# Patient Record
Sex: Female | Born: 1970
Health system: Southern US, Community
[De-identification: ages and names within clinical notes are randomized; demographics above are authoritative.]

## PROBLEM LIST (undated history)

## (undated) ENCOUNTER — Emergency Department: Admission: EM | Payer: BC Managed Care – PPO

## (undated) DIAGNOSIS — Z9851 Tubal ligation status: Secondary | ICD-10-CM

## (undated) DIAGNOSIS — N200 Calculus of kidney: Secondary | ICD-10-CM

## (undated) DIAGNOSIS — I10 Essential (primary) hypertension: Secondary | ICD-10-CM

## (undated) DIAGNOSIS — Z9049 Acquired absence of other specified parts of digestive tract: Secondary | ICD-10-CM

## (undated) DIAGNOSIS — F419 Anxiety disorder, unspecified: Secondary | ICD-10-CM

## (undated) HISTORY — PX: LITHOTRIPSY: SUR834

## (undated) HISTORY — PX: CHOLECYSTECTOMY: SHX55

## (undated) HISTORY — DX: Calculus of kidney: N20.0

---

## 1990-01-25 DIAGNOSIS — Z9851 Tubal ligation status: Secondary | ICD-10-CM

## 1990-01-25 HISTORY — DX: Tubal ligation status: Z98.51

## 2000-02-13 ENCOUNTER — Emergency Department (HOSPITAL_COMMUNITY): Admission: EM | Admit: 2000-02-13 | Discharge: 2000-02-13 | Payer: Self-pay | Admitting: Emergency Medicine

## 2000-02-14 ENCOUNTER — Encounter: Payer: Self-pay | Admitting: Emergency Medicine

## 2000-02-16 ENCOUNTER — Observation Stay (HOSPITAL_COMMUNITY): Admission: RE | Admit: 2000-02-16 | Discharge: 2000-02-17 | Payer: Self-pay | Admitting: General Surgery

## 2010-07-20 ENCOUNTER — Ambulatory Visit: Payer: Self-pay

## 2010-07-21 ENCOUNTER — Emergency Department (HOSPITAL_COMMUNITY): Payer: BC Managed Care – PPO

## 2010-07-21 ENCOUNTER — Encounter (HOSPITAL_COMMUNITY): Payer: Self-pay

## 2010-07-21 ENCOUNTER — Emergency Department (HOSPITAL_COMMUNITY)
Admission: EM | Admit: 2010-07-21 | Discharge: 2010-07-21 | Disposition: A | Discharge status: Home / Self Care | Payer: BC Managed Care – PPO | Attending: Emergency Medicine | Admitting: Emergency Medicine

## 2010-07-21 DIAGNOSIS — R300 Dysuria: Secondary | ICD-10-CM | POA: Insufficient documentation

## 2010-07-21 DIAGNOSIS — R109 Unspecified abdominal pain: Secondary | ICD-10-CM | POA: Insufficient documentation

## 2010-07-21 DIAGNOSIS — N200 Calculus of kidney: Secondary | ICD-10-CM | POA: Insufficient documentation

## 2010-07-21 HISTORY — DX: Tubal ligation status: Z98.51

## 2010-07-21 HISTORY — DX: Acquired absence of other specified parts of digestive tract: Z90.49

## 2010-07-21 LAB — URINALYSIS, ROUTINE W REFLEX MICROSCOPIC
Bilirubin Urine: NEGATIVE
Glucose, UA: NEGATIVE mg/dL
Ketones, ur: NEGATIVE mg/dL
Nitrite: NEGATIVE
Protein, ur: NEGATIVE mg/dL
Specific Gravity, Urine: 1.028 (ref 1.005–1.030)
Urobilinogen, UA: 0.2 mg/dL (ref 0.0–1.0)
pH: 5 (ref 5.0–8.0)

## 2010-07-21 LAB — BASIC METABOLIC PANEL
BUN: 9 mg/dL (ref 6–23)
CO2: 23 mEq/L (ref 19–32)
Calcium: 9.3 mg/dL (ref 8.4–10.5)
Chloride: 104 mEq/L (ref 96–112)
Creatinine, Ser: 0.67 mg/dL (ref 0.50–1.10)
GFR calc Af Amer: >60 mL/min (ref >60)
GFR calc non Af Amer: >60 mL/min (ref >60)
Glucose, Bld: 127 mg/dL — ABNORMAL HIGH (ref 70–99)
Potassium: 3.7 mEq/L (ref 3.5–5.1)
Sodium: 138 mEq/L (ref 135–145)

## 2010-07-21 LAB — DIFFERENTIAL
Basophils Absolute: 0 10*3/uL (ref 0.0–0.1)
Basophils Relative: 0 % (ref 0–1)
Eosinophils Absolute: 0.1 10*3/uL (ref 0.0–0.7)
Eosinophils Relative: 1 % (ref 0–5)
Lymphocytes Relative: 16 % (ref 12–46)
Lymphs Abs: 1.8 10*3/uL (ref 0.7–4.0)
Monocytes Absolute: 0.6 10*3/uL (ref 0.1–1.0)
Monocytes Relative: 5 % (ref 3–12)
Neutro Abs: 8.5 10*3/uL — ABNORMAL HIGH (ref 1.7–7.7)
Neutrophils Relative %: 77 % (ref 43–77)

## 2010-07-21 LAB — CBC
HCT: 40 % (ref 36.0–46.0)
Hemoglobin: 14 g/dL (ref 12.0–15.0)
MCH: 30.5 pg (ref 26.0–34.0)
MCHC: 35 g/dL (ref 30.0–36.0)
MCV: 87.1 fL (ref 78.0–100.0)
Platelets: 328 10*3/uL (ref 150–400)
RBC: 4.59 MIL/uL (ref 3.87–5.11)
RDW: 12.1 % (ref 11.5–15.5)
WBC: 11.1 10*3/uL — ABNORMAL HIGH (ref 4.0–10.5)

## 2010-07-21 LAB — URINE MICROSCOPIC-ADD ON

## 2010-07-21 LAB — POCT PREGNANCY, URINE: Preg Test, Ur: NEGATIVE

## 2010-07-23 ENCOUNTER — Ambulatory Visit (HOSPITAL_COMMUNITY)
Admission: RE | Admit: 2010-07-23 | Discharge: 2010-07-23 | Disposition: A | Discharge status: Home / Self Care | Payer: BC Managed Care – PPO | Source: Ambulatory Visit | Attending: Urology | Admitting: Urology

## 2010-07-23 DIAGNOSIS — E669 Obesity, unspecified: Secondary | ICD-10-CM | POA: Insufficient documentation

## 2010-07-23 DIAGNOSIS — N201 Calculus of ureter: Secondary | ICD-10-CM | POA: Insufficient documentation

## 2011-06-21 ENCOUNTER — Ambulatory Visit: Payer: Self-pay

## 2011-06-26 ENCOUNTER — Ambulatory Visit: Payer: Self-pay

## 2014-05-24 ENCOUNTER — Encounter (HOSPITAL_COMMUNITY): Payer: Self-pay | Admitting: *Deleted

## 2014-05-24 ENCOUNTER — Emergency Department (HOSPITAL_COMMUNITY)
Admission: EM | Admit: 2014-05-24 | Discharge: 2014-05-24 | Disposition: A | Discharge status: Home / Self Care | Payer: BC Managed Care – PPO | Attending: Emergency Medicine | Admitting: Emergency Medicine

## 2014-05-24 ENCOUNTER — Emergency Department (HOSPITAL_COMMUNITY): Payer: BC Managed Care – PPO

## 2014-05-24 DIAGNOSIS — F419 Anxiety disorder, unspecified: Secondary | ICD-10-CM | POA: Diagnosis not present

## 2014-05-24 DIAGNOSIS — Z87442 Personal history of urinary calculi: Secondary | ICD-10-CM | POA: Diagnosis not present

## 2014-05-24 DIAGNOSIS — I10 Essential (primary) hypertension: Secondary | ICD-10-CM | POA: Diagnosis not present

## 2014-05-24 DIAGNOSIS — R079 Chest pain, unspecified: Secondary | ICD-10-CM

## 2014-05-24 DIAGNOSIS — Z9851 Tubal ligation status: Secondary | ICD-10-CM | POA: Insufficient documentation

## 2014-05-24 DIAGNOSIS — Z9089 Acquired absence of other organs: Secondary | ICD-10-CM | POA: Diagnosis not present

## 2014-05-24 DIAGNOSIS — R55 Syncope and collapse: Secondary | ICD-10-CM | POA: Diagnosis not present

## 2014-05-24 DIAGNOSIS — Z79899 Other long term (current) drug therapy: Secondary | ICD-10-CM | POA: Diagnosis not present

## 2014-05-24 DIAGNOSIS — Z8742 Personal history of other diseases of the female genital tract: Secondary | ICD-10-CM | POA: Insufficient documentation

## 2014-05-24 HISTORY — DX: Essential (primary) hypertension: I10

## 2014-05-24 HISTORY — DX: Anxiety disorder, unspecified: F41.9

## 2014-05-24 LAB — COMPREHENSIVE METABOLIC PANEL
ALT: 29 U/L (ref 0–35)
AST: 26 U/L (ref 0–37)
Albumin: 4.1 g/dL (ref 3.5–5.2)
Alkaline Phosphatase: 65 U/L (ref 39–117)
Anion gap: 10 (ref 5–15)
BUN: 9 mg/dL (ref 6–23)
CO2: 24 mmol/L (ref 19–32)
Calcium: 9.4 mg/dL (ref 8.4–10.5)
Chloride: 106 mmol/L (ref 96–112)
Creatinine, Ser: 0.68 mg/dL (ref 0.50–1.10)
GFR calc Af Amer: >90 mL/min (ref >90)
GFR calc non Af Amer: >90 mL/min (ref >90)
Glucose, Bld: 145 mg/dL — ABNORMAL HIGH (ref 70–99)
Potassium: 3.2 mmol/L — ABNORMAL LOW (ref 3.5–5.1)
Sodium: 140 mmol/L (ref 135–145)
Total Bilirubin: 0.5 mg/dL (ref 0.3–1.2)
Total Protein: 7.7 g/dL (ref 6.0–8.3)

## 2014-05-24 LAB — CBC
HCT: 38.7 % (ref 36.0–46.0)
Hemoglobin: 13.5 g/dL (ref 12.0–15.0)
MCH: 30.3 pg (ref 26.0–34.0)
MCHC: 34.9 g/dL (ref 30.0–36.0)
MCV: 87 fL (ref 78.0–100.0)
Platelets: 283 10*3/uL (ref 150–400)
RBC: 4.45 MIL/uL (ref 3.87–5.11)
RDW: 12.2 % (ref 11.5–15.5)
WBC: 8.7 10*3/uL (ref 4.0–10.5)

## 2014-05-24 LAB — TROPONIN I: Troponin I: <0.03 ng/mL (ref <0.031)

## 2014-05-24 NOTE — ED Provider Notes (Signed)
CSN: [id number]     Arrival date & time 05/24/14  1425 History   First MD Initiated Contact with Patient 05/24/14 1652     Chief Complaint  Patient presents with  . Chest Pain  . Anxiety     (Consider location/radiation/quality/duration/timing/severity/associated sxs/prior Treatment) HPI Comments: Patient is a 44 year old female with history of anxiety. She presents for evaluation of chest tightness that she has been experiencing intermittently for the past 3 weeks. This occurs with exertion and with rest and lasts for several minutes. This discomfort radiates to her shoulders but she denies shortness of breath. Today she began to feel somewhat dizzy and as if she might pass out. She also reports tingling in her fingers that occurs along with the symptoms. She has been evaluated for this in the past and told that she may have anxiety.  She also reports a recent job change that seems to have coincided with the onset of her symptoms. She states that her work is now more physically and mentally strenuous.  Patient is a 44 y.o. female presenting with chest pain and anxiety. The history is provided by the patient.  Chest Pain Pain location:  Substernal area Pain quality: tightness   Pain radiates to:  R shoulder and L shoulder Pain radiates to the back: no   Pain severity:  Mild Onset quality:  Gradual Duration:  3 weeks Timing:  Intermittent Progression:  Worsening Chronicity:  New Relieved by:  Nothing Worsened by:  Nothing tried Ineffective treatments:  None tried Associated symptoms: anxiety   Anxiety Associated symptoms include chest pain.    Past Medical History  Diagnosis Date  . S/P tubal ligation 1992  . S/P cholecystectomy t  . Anxiety   . Renal disorder     kidney stones  . Hypertension    Past Surgical History  Procedure Laterality Date  . Cholecystectomy    . Lithotripsy     No family history on file. History  Substance Use Topics  . Smoking status: Never  Smoker   . Smokeless tobacco: Not on file  . Alcohol Use: No   OB History    No data available     Review of Systems  Cardiovascular: Positive for chest pain.  All other systems reviewed and are negative.     Allergies  Levaquin; Other; Paroxetine hcl; and Zithromax  Home Medications   Prior to Admission medications   Medication Sig Start Date End Date Taking? Authorizing Provider  ALPRAZolam Prudy Feeler) 0.5 MG tablet Take 0.5 mg by mouth at bedtime as needed for anxiety.   Yes Historical Provider, MD  hydrochlorothiazide (HYDRODIURIL) 12.5 MG tablet Take 12.5 mg by mouth daily.   Yes Historical Provider, MD   BP 153/95 mmHg  Pulse 88  Temp(Src) 98.1 F (36.7 C) (Oral)  Resp 16  Ht  (1.575 m)  Wt 210 lb (95.255 kg)  BMI 38.40 kg/m2  SpO2 94%  LMP 03/25/2014 Physical Exam  Constitutional: She is oriented to person, place, and time. She appears well-developed and well-nourished. No distress.  HENT:  Head: Normocephalic and atraumatic.  Neck: Normal range of motion. Neck supple.  Cardiovascular: Normal rate and regular rhythm.  Exam reveals no gallop and no friction rub.   No murmur heard. Pulmonary/Chest: Effort normal and breath sounds normal. No respiratory distress. She has no wheezes.  Abdominal: Soft. Bowel sounds are normal. She exhibits no distension. There is no tenderness.  Musculoskeletal: Normal range of motion.  Neurological: She is alert  and oriented to person, place, and time.  Skin: Skin is warm and dry. She is not diaphoretic.  Nursing note and vitals reviewed.   ED Course  Procedures (including critical care time) Labs Review Labs Reviewed  COMPREHENSIVE METABOLIC PANEL - Abnormal; Notable for the following:    Potassium 3.2 (*)    Glucose, Bld 145 (*)    All other components within normal limits  CBC  TROPONIN I    Imaging Review Dg Chest 2 View  05/24/2014   CLINICAL DATA:  Shortness of breath, chest pain off and on for 1 month.  Anxiety.  EXAM: CHEST  2 VIEW  COMPARISON:  None.  FINDINGS: Mild peribronchial thickening. Heart and mediastinal contours are within normal limits. No focal opacities or effusions. No acute bony abnormality.  IMPRESSION: Mild bronchitic changes.   Electronically Signed   By: Charlett NoseKevin  Dover M.D.   On: 05/24/2014 16:14     EKG Interpretation   Date/Time:  Friday May 24 2014 14:35:16 EDT Ventricular Rate:  95 PR Interval:  138 QRS Duration: 82 QT Interval:  354 QTC Calculation: 444 R Axis:   -10 Text Interpretation:  Normal sinus rhythm Normal ECG Confirmed by DELOS   MD, Gabriel Conry ([id number]) on 05/24/2014 5:26:34 PM      MDM   Final diagnoses:  None    Patient presents with chest tightness, dizziness, and near syncope that has been occurring intermittently for the past several weeks. Her EKG is unremarkable and laboratory studies are normal. She seems quite stressed out about a recent job change and I suspect this may be anxiety related. She reports a history of heart disease in her family and I feel as though follow-up with cardiology to discuss a stress test would be in her best interest. She will be given the contact information for the cardiology clinic whom she is to call to schedule this appointment. She understands to return in the meantime if her symptoms significantly worsen or change.    Geoffery Lyonsouglas Azriel Dancy, MD 05/24/14 [phone]

## 2014-05-24 NOTE — Discharge Instructions (Signed)
Follow-up with cardiology. The contact information has been provided in this discharge summary. Call to arrange this appointment.  Return to the emergency department if your symptoms significantly worsen or change.   Chest Pain (Nonspecific) It is often hard to give a specific diagnosis for the cause of chest pain. There is always a chance that your pain could be related to something serious, such as a heart attack or a blood clot in the lungs. You need to follow up with your health care provider for further evaluation. CAUSES   Heartburn.  Pneumonia or bronchitis.  Anxiety or stress.  Inflammation around your heart (pericarditis) or lung (pleuritis or pleurisy).  A blood clot in the lung.  A collapsed lung (pneumothorax). It can develop suddenly on its own (spontaneous pneumothorax) or from trauma to the chest.  Shingles infection (herpes zoster virus). The chest wall is composed of bones, muscles, and cartilage. Any of these can be the source of the pain.  The bones can be bruised by injury.  The muscles or cartilage can be strained by coughing or overwork.  The cartilage can be affected by inflammation and become sore (costochondritis). DIAGNOSIS  Lab tests or other studies may be needed to find the cause of your pain. Your health care provider may have you take a test called an ambulatory electrocardiogram (ECG). An ECG records your heartbeat patterns over a 24-hour period. You may also have other tests, such as:  Transthoracic echocardiogram (TTE). During echocardiography, sound waves are used to evaluate how blood flows through your heart.  Transesophageal echocardiogram (TEE).  Cardiac monitoring. This allows your health care provider to monitor your heart rate and rhythm in real time.  Holter monitor. This is a portable device that records your heartbeat and can help diagnose heart arrhythmias. It allows your health care provider to track your heart activity for several  days, if needed.  Stress tests by exercise or by giving medicine that makes the heart beat faster. TREATMENT   Treatment depends on what may be causing your chest pain. Treatment may include:  Acid blockers for heartburn.  Anti-inflammatory medicine.  Pain medicine for inflammatory conditions.  Antibiotics if an infection is present.  You may be advised to change lifestyle habits. This includes stopping smoking and avoiding alcohol, caffeine, and chocolate.  You may be advised to keep your head raised (elevated) when sleeping. This reduces the chance of acid going backward from your stomach into your esophagus. Most of the time, nonspecific chest pain will improve within 2-3 days with rest and mild pain medicine.  HOME CARE INSTRUCTIONS   If antibiotics were prescribed, take them as directed. Finish them even if you start to feel better.  For the next few days, avoid physical activities that bring on chest pain. Continue physical activities as directed.  Do not use any tobacco products, including cigarettes, chewing tobacco, or electronic cigarettes.  Avoid drinking alcohol.  Only take medicine as directed by your health care provider.  Follow your health care provider's suggestions for further testing if your chest pain does not go away.  Keep any follow-up appointments you made. If you do not go to an appointment, you could develop lasting (chronic) problems with pain. If there is any problem keeping an appointment, call to reschedule. SEEK MEDICAL CARE IF:   Your chest pain does not go away, even after treatment.  You have a rash with blisters on your chest.  You have a fever. SEEK IMMEDIATE MEDICAL CARE IF:  You have increased chest pain or pain that spreads to your arm, neck, jaw, back, or abdomen.  You have shortness of breath.  You have an increasing cough, or you cough up blood.  You have severe back or abdominal pain.  You feel nauseous or vomit.  You  have severe weakness.  You faint.  You have chills. This is an emergency. Do not wait to see if the pain will go away. Get medical help at once. Call your local emergency services (911 in [place] Do not drive yourself to the hospital. MAKE SURE YOU:   Understand these instructions.  Will watch your condition.  Will get help right away if you are not doing well or get worse. Document Released: 10/21/2004 Document Revised: 01/16/2013 Document Reviewed: 08/17/2007 [facility] Patient Information 2015 [place], [place]. This information is not intended to replace advice given to you by your health care provider. Make sure you discuss any questions you have with your health care provider.

## 2014-05-24 NOTE — ED Notes (Signed)
Pt diaphoretic, tired and c/o R shoulder pain that radiates to her back and into her R arm.   Also states sob.  VS stable.  States multiple visits to ER with same s/s that end up being dx as anxiety.

## 2014-10-08 ENCOUNTER — Institutional Professional Consult (permissible substitution): Payer: BC Managed Care – PPO | Admitting: Internal Medicine

## 2015-02-17 ENCOUNTER — Telehealth: Payer: Self-pay | Admitting: Primary Care

## 2015-02-17 NOTE — Telephone Encounter (Signed)
Okay to do as long as no other complaints.

## 2015-02-17 NOTE — Telephone Encounter (Signed)
Called and notified patient of [name]'s comments. Patient verbalized understanding.  

## 2015-02-17 NOTE — Telephone Encounter (Signed)
Patient's husband called to ask if patient can have a physical done the same time as her new patient appointment.  Patient has a hard time getting off work and would like to have it done the same day.  Patient isn't having any problems. Please advise.

## 2015-03-24 ENCOUNTER — Encounter: Payer: Self-pay | Admitting: Primary Care

## 2015-03-24 ENCOUNTER — Encounter (INDEPENDENT_AMBULATORY_CARE_PROVIDER_SITE_OTHER): Payer: Self-pay

## 2015-03-24 ENCOUNTER — Ambulatory Visit (INDEPENDENT_AMBULATORY_CARE_PROVIDER_SITE_OTHER): Payer: BC Managed Care – PPO | Admitting: Primary Care

## 2015-03-24 ENCOUNTER — Ambulatory Visit: Payer: BC Managed Care – PPO | Admitting: Primary Care

## 2015-03-24 VITALS — BP 126/84 | HR 81 | Temp 99.2°F | Ht 62.0 in | Wt 206.8 lb

## 2015-03-24 DIAGNOSIS — I10 Essential (primary) hypertension: Secondary | ICD-10-CM | POA: Diagnosis not present

## 2015-03-24 DIAGNOSIS — R300 Dysuria: Secondary | ICD-10-CM

## 2015-03-24 DIAGNOSIS — F411 Generalized anxiety disorder: Secondary | ICD-10-CM | POA: Insufficient documentation

## 2015-03-24 LAB — POC URINALSYSI DIPSTICK (AUTOMATED)
Bilirubin, UA: NEGATIVE
Glucose, UA: NEGATIVE
Ketones, UA: NEGATIVE
Leukocytes, UA: NEGATIVE
Nitrite, UA: NEGATIVE
Protein, UA: NEGATIVE
Spec Grav, UA: >=1.03
Urobilinogen, UA: NEGATIVE
pH, UA: 5.5

## 2015-03-24 MED ORDER — VENLAFAXINE HCL ER 37.5 MG PO CP24
37.5000 mg | ORAL_CAPSULE | Freq: Every day | ORAL | Status: DC
Start: 2015-03-24 — End: 2015-07-11

## 2015-03-24 MED ORDER — PHENAZOPYRIDINE HCL 200 MG PO TABS: 200.0000 mg | ORAL_TABLET | Freq: Three times a day (TID) | ORAL | Status: DC | PRN

## 2015-03-24 NOTE — Progress Notes (Signed)
Subjective:    Patient ID: Kayla Robles, female    DOB: April 01, 1970, 45 y.o.   MRN: [id number]  HPI  Kayla Robles is a 45 year old female who presents today to establish care and discuss the problems mentioned below. Will obtain old records. She's not completed a physical in over 1 year.   1) Generalized Anxiety Disorder: Diagnosed in 2003. Currently managed on alprazolam 0.5 mg once nightly. GAD 7 score of 12. She was once managed on Paxil but didn't feel well managed and also had suicidal thoughts on this medication.   2) Urinary Frequency: History of frequent UTI's in the past. She also has a history of kidney stones that were removed surgically years ago. She also reports dysuria, hematuria. She's been taking Cipro 500 mg BID from and old RX. She's noticed a moderate reduction overall in symptoms. This RX is one year out of date. Denies fevers, vaginal itching.   3) Essential Hypertension: Currently managed on HCTZ 12.5 mg daily. BP stable in the clinic today. Denies chest pain, dizziness, headaches, lower extremity edema.  Review of Systems  Constitutional: Positive for fever.  HENT: Negative for rhinorrhea.   Respiratory: Negative for shortness of breath.   Cardiovascular: Negative for chest pain.  Gastrointestinal: Negative for nausea.  Genitourinary: Positive for dysuria, frequency and vaginal discharge. Negative for difficulty urinating.  Musculoskeletal: Negative for myalgias.  Skin: Negative for rash.  Neurological: Negative for dizziness and headaches.  Psychiatric/Behavioral: Negative for suicidal ideas. The patient is nervous/anxious.        Past Medical History  Diagnosis Date  . S/P tubal ligation 1992  . S/P cholecystectomy t  . Anxiety   . Kidney stones   . Hypertension     Social History   Social History  . Marital Status: Married    Spouse Name: N/A  . Number of Children: N/A  . Years of Education: N/A   Occupational History  . Not on file.    Social History Main Topics  . Smoking status: Never Smoker   . Smokeless tobacco: Not on file  . Alcohol Use: No  . Drug Use: No  . Sexual Activity: Not on file   Other Topics Concern  . Not on file   Social History Narrative   Married.   Works as a Air traffic controller.   Enjoys playing with her grandchildren.       Past Surgical History  Procedure Laterality Date  . Cholecystectomy    . Lithotripsy      Family History  Problem Relation Age of Onset  . Arthritis Mother   . Heart disease Mother   . Hypertension Mother   . Hypertension Father   . Diabetes Father     Allergies  Allergen Reactions  . Levaquin [Levofloxacin]     "felt weird"  . Other     Most anxiety medications  . Paroxetine Hcl   . Zithromax [Azithromycin]     Current Outpatient Prescriptions on File Prior to Visit  Medication Sig Dispense Refill  . ALPRAZolam (XANAX) 0.5 MG tablet Take 0.5 mg by mouth at bedtime as needed for anxiety.    . hydrochlorothiazide (HYDRODIURIL) 12.5 MG tablet Take 12.5 mg by mouth daily.     No current facility-administered medications on file prior to visit.    BP 126/84 mmHg  Pulse 81  Temp(Src) 99.2 F (37.3 C) (Oral)  Ht  (1.575 m)  Wt 206 lb 12.8 oz (93.804 kg)  BMI  37.81 kg/m2  SpO2 96%  LMP 03/10/2015    Objective:   Physical Exam  Constitutional: She is oriented to person, place, and time. She appears well-nourished.  Neck: Neck supple.  Cardiovascular: Normal rate and regular rhythm.   Pulmonary/Chest: Effort normal and breath sounds normal.  Abdominal: There is no CVA tenderness.  Neurological: She is alert and oriented to person, place, and time.  Skin: Skin is warm and dry.  Psychiatric:  Anxious during examination.          Assessment & Plan:  >45 minutes spent face to face with patient, >50% spent counseling or coordinating care.  Dysuria:  Also with urinary frequency, pelvic discomfort. UA: negative for leuks and  nitirites. 2+ blood. Exam unremarkable for renal stones. Discussed that her Cipro treatment helped.  Will add pyridium for symptoms as UA does not suggest any bacterial involvement. Increase water consumption, return precautions provided.

## 2015-03-24 NOTE — Assessment & Plan Note (Signed)
Currently managed on HCTZ 12.5 mg. BP stable in clinic today. Continue current regimen.

## 2015-03-24 NOTE — Progress Notes (Signed)
Pre visit review using our clinic review tool, if applicable. No additional management support is needed unless otherwise documented below in the visit note. 

## 2015-03-24 NOTE — Assessment & Plan Note (Signed)
Uncontrolled. GAD 7 score of 12 today. Discussed that alprazolam is not used to treat long term GAD and that we will need to start daily medication in hopes of weaning off of alprazolam. Start low dose effexor. We discussed possible side effects of headache, GI upset, drowsiness, and SI/HI. If thoughts of SI/HI develop, we discussed to present to the emergency immediately. Patient verbalized understanding.   Refill provided of alprazolam while she is transitioning over to effexor. UDS and contract obtained today.  Follow up in 4-6 weeks.

## 2015-03-24 NOTE — Patient Instructions (Signed)
Start Venlafaxine XR 37.5 mg tablets for anxiety. Take 1 tablet by mouth every morning.  Use the alprazolam as needed for breakthrough panic attacks/anxiety. May continue to use at bedtime as needed.  You may take the pyridium three times daily as needed for urinary symptoms of burning and pelvic discomfort.  Schedule a follow up in appointment in 4 to 6 weeks for re-evaluation of anxiety.  It was a pleasure to meet you today! Please don't hesitate to call me with any questions. Welcome to [facility]!

## 2015-03-25 LAB — URINE CULTURE
Colony Count: NO GROWTH
Organism ID, Bacteria: NO GROWTH

## 2015-03-26 ENCOUNTER — Telehealth: Payer: Self-pay | Admitting: Primary Care

## 2015-03-26 NOTE — Telephone Encounter (Signed)
Message left for patient to return my call.  

## 2015-03-26 NOTE — Telephone Encounter (Signed)
Please notify Ms. Kayla Robles that her records have been reviewed and are ready for pick up at her convenience.

## 2015-03-27 NOTE — Telephone Encounter (Signed)
Pt called back. Notified of Kates comments. She will have her spouse pick up records.

## 2015-04-01 ENCOUNTER — Encounter: Payer: Self-pay | Admitting: Primary Care

## 2015-05-14 ENCOUNTER — Telehealth: Payer: Self-pay | Admitting: Primary Care

## 2015-05-14 NOTE — Telephone Encounter (Signed)
Patient returned Chan's call.  Patient is on break and has to go back to work at 3:30.  Patient asked for Johny DrillingChan to leave a detailed message on her voice mail, if she doesn't answer.

## 2015-05-19 ENCOUNTER — Encounter: Payer: Self-pay | Admitting: Primary Care

## 2015-05-19 ENCOUNTER — Other Ambulatory Visit: Payer: BC Managed Care – PPO

## 2015-05-19 ENCOUNTER — Ambulatory Visit (INDEPENDENT_AMBULATORY_CARE_PROVIDER_SITE_OTHER): Payer: BC Managed Care – PPO | Admitting: Primary Care

## 2015-05-19 VITALS — BP 142/92 | HR 86 | Temp 98.8°F | Ht 62.0 in | Wt 205.4 lb

## 2015-05-19 DIAGNOSIS — R110 Nausea: Secondary | ICD-10-CM

## 2015-05-19 DIAGNOSIS — F411 Generalized anxiety disorder: Secondary | ICD-10-CM | POA: Diagnosis not present

## 2015-05-19 MED ORDER — ONDANSETRON 4 MG PO TBDP: 4.0000 mg | ORAL_TABLET | Freq: Three times a day (TID) | ORAL | Status: DC | PRN

## 2015-05-19 NOTE — Assessment & Plan Note (Signed)
Improved on Effexor. She's also working to wean off Alprazolam by taking 1/2 tablet every other night on average. Discussed use of Melatonin PRN for insomnia. Will continue to monitor.

## 2015-05-19 NOTE — Patient Instructions (Signed)
Continue Venlafaxine 37.5 mg tablets for anxiety.   Continue to wean off Alprazolam as discussed by taking 1/2 tablet by mouth as needed for anxiety.  You may try switching to Melatonin or Unisom as needed for insomnia.  You may take the Zofran (ondansetron) tablets every 8 hours as needed for nausea.   Ensure you are staying hydrated with water and advance your diet slowly.  Please schedule a physical with me at your convenience.. You may also schedule a lab only appointment 3-4 days prior. We will discuss your lab results in detail during your physical.  It was a pleasure to see you today!  Food Choices to Help Relieve Diarrhea, Adult When you have diarrhea, the foods you eat and your eating habits are very important. Choosing the right foods and drinks can help relieve diarrhea. Also, because diarrhea can last up to 7 days, you need to replace lost fluids and electrolytes (such as sodium, potassium, and chloride) in order to help prevent dehydration.  WHAT GENERAL GUIDELINES DO I NEED TO FOLLOW?  Slowly drink 1 cup (8 oz) of fluid for each episode of diarrhea. If you are getting enough fluid, your urine will be clear or pale yellow.  Eat starchy foods. Some good choices include white rice, white toast, pasta, low-fiber cereal, baked potatoes (without the skin), saltine crackers, and bagels.  Avoid large servings of any cooked vegetables.  Limit fruit to two servings per day. A serving is  cup or 1 small piece.  Choose foods with less than 2 g of fiber per serving.  Limit fats to less than 8 tsp (38 g) per day.  Avoid fried foods.  Eat foods that have probiotics in them. Probiotics can be found in certain dairy products.  Avoid foods and beverages that may increase the speed at which food moves through the stomach and intestines (gastrointestinal tract). Things to avoid include:  High-fiber foods, such as dried fruit, raw fruits and vegetables, nuts, seeds, and whole grain  foods.  Spicy foods and high-fat foods.  Foods and beverages sweetened with high-fructose corn syrup, honey, or sugar alcohols such as xylitol, sorbitol, and mannitol. WHAT FOODS ARE RECOMMENDED? Grains White rice. White, [place], or pita breads (fresh or toasted), including plain rolls, buns, or bagels. White pasta. Saltine, soda, or graham crackers. Pretzels. Low-fiber cereal. Cooked cereals made with water (such as cornmeal, farina, or cream cereals). Plain muffins. Matzo. Melba toast. Zwieback.  Vegetables Potatoes (without the skin). Strained tomato and vegetable juices. Most well-cooked and canned vegetables without seeds. Tender lettuce. Fruits Cooked or canned applesauce, apricots, cherries, fruit cocktail, grapefruit, peaches, pears, or plums. Fresh bananas, apples without skin, cherries, grapes, cantaloupe, grapefruit, peaches, oranges, or plums.  Meat and Other Protein Products Baked or boiled chicken. Eggs. Tofu. Fish. Seafood. Smooth peanut butter. Ground or well-cooked tender beef, ham, veal, lamb, pork, or poultry.  Dairy Plain yogurt, kefir, and unsweetened liquid yogurt. Lactose-free milk, buttermilk, or soy milk. Plain hard cheese. Beverages Sport drinks. Clear broths. Diluted fruit juices (except prune). Regular, caffeine-free sodas such as ginger ale. Water. Decaffeinated teas. Oral rehydration solutions. Sugar-free beverages not sweetened with sugar alcohols. Other Bouillon, broth, or soups made from recommended foods.  The items listed above may not be a complete list of recommended foods or beverages. Contact your dietitian for more options. WHAT FOODS ARE NOT RECOMMENDED? Grains Whole grain, whole wheat, bran, or rye breads, rolls, pastas, crackers, and cereals. Wild or brown rice. Cereals that contain more than  2 g of fiber per serving. Corn tortillas or taco shells. Cooked or dry oatmeal. Granola. Popcorn. Vegetables Raw vegetables. Cabbage, broccoli, Brussels  sprouts, artichokes, baked beans, beet greens, corn, kale, legumes, peas, sweet potatoes, and yams. Potato skins. Cooked spinach and cabbage. Fruits Dried fruit, including raisins and dates. Raw fruits. Stewed or dried prunes. Fresh apples with skin, apricots, mangoes, pears, raspberries, and strawberries.  Meat and Other Protein Products Chunky peanut butter. Nuts and seeds. Beans and lentils. Tomasa Blase.  Dairy High-fat cheeses. Milk, chocolate milk, and beverages made with milk, such as milk shakes. Cream. Ice cream. Sweets and Desserts Sweet rolls, doughnuts, and sweet breads. Pancakes and waffles. Fats and Oils Butter. Cream sauces. Margarine. Salad oils. Plain salad dressings. Olives. Avocados.  Beverages Caffeinated beverages (such as coffee, tea, soda, or energy drinks). Alcoholic beverages. Fruit juices with pulp. Prune juice. Soft drinks sweetened with high-fructose corn syrup or sugar alcohols. Other Coconut. Hot sauce. Chili powder. Mayonnaise. Gravy. Cream-based or milk-based soups.  The items listed above may not be a complete list of foods and beverages to avoid. Contact your dietitian for more information. WHAT SHOULD I DO IF I BECOME DEHYDRATED? Diarrhea can sometimes lead to dehydration. Signs of dehydration include dark urine and dry mouth and skin. If you think you are dehydrated, you should rehydrate with an oral rehydration solution. These solutions can be purchased at pharmacies, retail stores, or online.  Drink -1 cup (120-240 mL) of oral rehydration solution each time you have an episode of diarrhea. If drinking this amount makes your diarrhea worse, try drinking smaller amounts more often. For example, drink 1-3 tsp (5-15 mL) every 5-10 minutes.  A general rule for staying hydrated is to drink 1-2 L of fluid per day. Talk to your health care provider about the specific amount you should be drinking each day. Drink enough fluids to keep your urine clear or pale yellow.     This information is not intended to replace advice given to you by your health care provider. Make sure you discuss any questions you have with your health care provider.   Document Released: 04/03/2003 Document Revised: 02/01/2014 Document Reviewed: 12/04/2012 Elsevier Interactive Patient Education [employer]

## 2015-05-19 NOTE — Progress Notes (Signed)
Pre visit review using our clinic review tool, if applicable. No additional management support is needed unless otherwise documented below in the visit note. 

## 2015-05-19 NOTE — Progress Notes (Signed)
Subjective:    Patient ID: Kayla Robles, female    DOB: Mar 29, 1970, 45 y.o.   MRN: [id number]  HPI  [name] is a 45 year old female who presents today for follow up of Generalized Anxiety Disorder. She was evaluated in late February 2017 as a new patient with complaints of anxiety. She was managed solely on Alprazolam 0.5 mg every night for anxiety as she failed treatment on Paxil in the past. Last visit she was initiated on Effexor 37.5 mg with a goal to wean off Alprazolam eventually.   Since her last visit she's feeling much improved in her anxiety. She is feeling more upbeat, she is able to "let things go" that once bothered her, is less irritable. Her family and co-workers have noticed an improvement in her anxiety. She's also trying to wean off of her Alprazolam as she's taking 1/2 tablet every other night on average. Denies chest pain, SI/HI, headaches.   2) Nausea/Vomiting/Diarrnea: Present since this morning. She's been around her husband who's had the same symptoms this past weekend. She's not taken anything this morning for her symptoms. Denies abdominal pain. She has had diarrhea for the past 24 hours.  Review of Systems  Constitutional: Positive for fatigue. Negative for fever and chills.  Gastrointestinal: Positive for nausea, vomiting and diarrhea. Negative for abdominal pain.  Neurological: Negative for headaches.  Psychiatric/Behavioral: Positive for sleep disturbance. Negative for suicidal ideas. The patient is not nervous/anxious.        Past Medical History  Diagnosis Date  . S/P tubal ligation 1992  . S/P cholecystectomy t  . Anxiety   . Kidney stones   . Hypertension      Social History   Social History  . Marital Status: Married    Spouse Name: N/A  . Number of Children: N/A  . Years of Education: N/A   Occupational History  . Not on file.   Social History Main Topics  . Smoking status: Never Smoker   . Smokeless tobacco: Not on file  .  Alcohol Use: No  . Drug Use: No  . Sexual Activity: Not on file   Other Topics Concern  . Not on file   Social History Narrative   Married.   Works as a Air traffic controller.   Enjoys playing with her grandchildren.       Past Surgical History  Procedure Laterality Date  . Cholecystectomy    . Lithotripsy      Family History  Problem Relation Age of Onset  . Arthritis Mother   . Heart disease Mother   . Hypertension Mother   . Hypertension Father   . Diabetes Father     Allergies  Allergen Reactions  . Levaquin [Levofloxacin]     "felt weird"  . Other     Most anxiety medications  . Paroxetine Hcl   . Zithromax [Azithromycin]     Current Outpatient Prescriptions on File Prior to Visit  Medication Sig Dispense Refill  . ALPRAZolam (XANAX) 0.5 MG tablet Take 0.5 mg by mouth at bedtime as needed for anxiety.    . hydrochlorothiazide (HYDRODIURIL) 12.5 MG tablet Take 12.5 mg by mouth daily.    Marland Kitchen venlafaxine XR (EFFEXOR XR) 37.5 MG 24 hr capsule Take 1 capsule (37.5 mg total) by mouth daily with breakfast. 30 capsule 3   No current facility-administered medications on file prior to visit.    BP 142/92 mmHg  Pulse 86  Temp(Src) 98.8 F (37.1 C) (Oral)  Ht 5\' 2"  (1.575 m)  Wt 205 lb 6.4 oz (93.169 kg)  BMI 37.56 kg/m2  SpO2 96%  LMP 05/11/2015    Objective:   Physical Exam  Constitutional: She appears well-nourished. She appears ill.  Cardiovascular: Normal rate and regular rhythm.   Pulmonary/Chest: Effort normal and breath sounds normal.  Abdominal: Soft. Bowel sounds are normal. There is no tenderness.  Skin: Skin is warm and dry.  Psychiatric: She has a normal mood and affect.          Assessment & Plan:  N/V/D:  Diarrhea x 24 hours.  Sudden onset of nausea with vomiting during our visit today. Exam unremarkable, although does appear ill. Rx for Zofran PRN. Discussed importance for adequate fluid intake to prevent dehydration. Advance diet as  tolerated. Return precautions provided.

## 2015-06-25 ENCOUNTER — Telehealth: Payer: Self-pay | Admitting: Primary Care

## 2015-06-25 DIAGNOSIS — I10 Essential (primary) hypertension: Secondary | ICD-10-CM

## 2015-06-25 MED ORDER — HYDROCHLOROTHIAZIDE 12.5 MG PO TABS: 12.5000 mg | ORAL_TABLET | Freq: Every day | ORAL | Status: DC

## 2015-06-25 NOTE — Telephone Encounter (Signed)
Called and notified patient that refill has [facility] to [facility] as requested.

## 2015-06-25 NOTE — Telephone Encounter (Signed)
Pt left voice message on triage phone requesting refill on hydrochlorothiazide (HYDRODIURIL) 12.5 MG tablet.  Pt last seen on 05/19/15 for acute visit and on 03/24/15 for office visit.  [facility] of choice is [facility].  Patient requesting callback at work number [phone], ask for pt by name.

## 2015-06-25 NOTE — Telephone Encounter (Signed)
Message left for patient to return my call.  

## 2015-06-25 NOTE — Telephone Encounter (Signed)
Refill sent to pharmacy.   

## 2015-07-11 ENCOUNTER — Other Ambulatory Visit: Payer: Self-pay | Admitting: Primary Care

## 2015-07-31 ENCOUNTER — Telehealth: Payer: Self-pay

## 2015-07-31 NOTE — Telephone Encounter (Addendum)
Spoken to patient and she does not have a form to be filled out. After speaking to patient about why she needed the blood work done. Come to the conclusion that patient is asking for a physical with same day labs. Patient verbalized understanding. Patient will call back later and schedule a physical and have lab done the same day.

## 2015-07-31 NOTE — Telephone Encounter (Signed)
Noted. We will evaluate patient at her upcoming physical.

## 2015-07-31 NOTE — Telephone Encounter (Signed)
Pt left v/m; pt is not able to schedule CPX at this time but needs to [facility] and have labs drawn for ins purposes. Pt request Lipid, CMP and CBC and anything else Jae DireKate would need. Pt request cb with appt if OKed by Jae DireKate.last f/u 05/19/15.

## 2015-07-31 NOTE — Telephone Encounter (Signed)
Message left for patient to return my call.  

## 2015-07-31 NOTE — Telephone Encounter (Signed)
Does she have a form that is to be completed? If so most of the time those forms require a physical examination which would require an appointment.  If no form completion is required then I will order labs but cannot do this routinely.

## 2015-08-11 ENCOUNTER — Other Ambulatory Visit: Payer: Self-pay | Admitting: Primary Care

## 2015-08-11 MED ORDER — VENLAFAXINE HCL ER 37.5 MG PO CP24: ORAL_CAPSULE | ORAL | Status: DC

## 2015-09-08 ENCOUNTER — Ambulatory Visit (INDEPENDENT_AMBULATORY_CARE_PROVIDER_SITE_OTHER): Payer: BC Managed Care – PPO | Admitting: Primary Care

## 2015-09-08 ENCOUNTER — Other Ambulatory Visit (HOSPITAL_COMMUNITY)
Admission: RE | Admit: 2015-09-08 | Discharge: 2015-09-08 | Disposition: A | Discharge status: Home / Self Care | Payer: BC Managed Care – PPO | Source: Ambulatory Visit | Attending: Primary Care | Admitting: Primary Care

## 2015-09-08 VITALS — BP 134/84 | HR 78 | Temp 98.7°F | Ht 62.0 in | Wt 209.0 lb

## 2015-09-08 DIAGNOSIS — I10 Essential (primary) hypertension: Secondary | ICD-10-CM | POA: Diagnosis not present

## 2015-09-08 DIAGNOSIS — Z01419 Encounter for gynecological examination (general) (routine) without abnormal findings: Secondary | ICD-10-CM | POA: Insufficient documentation

## 2015-09-08 DIAGNOSIS — E119 Type 2 diabetes mellitus without complications: Secondary | ICD-10-CM | POA: Insufficient documentation

## 2015-09-08 DIAGNOSIS — Z0000 Encounter for general adult medical examination without abnormal findings: Secondary | ICD-10-CM

## 2015-09-08 DIAGNOSIS — Z1151 Encounter for screening for human papillomavirus (HPV): Secondary | ICD-10-CM | POA: Diagnosis not present

## 2015-09-08 DIAGNOSIS — F411 Generalized anxiety disorder: Secondary | ICD-10-CM | POA: Diagnosis not present

## 2015-09-08 DIAGNOSIS — Z124 Encounter for screening for malignant neoplasm of cervix: Secondary | ICD-10-CM

## 2015-09-08 DIAGNOSIS — R7303 Prediabetes: Secondary | ICD-10-CM | POA: Diagnosis not present

## 2015-09-08 DIAGNOSIS — E1165 Type 2 diabetes mellitus with hyperglycemia: Secondary | ICD-10-CM | POA: Insufficient documentation

## 2015-09-08 LAB — LIPID PANEL
Cholesterol: 201 mg/dL — ABNORMAL HIGH (ref 0–200)
HDL: 38.4 mg/dL — ABNORMAL LOW (ref >39.00)
NonHDL: 162.74
Total CHOL/HDL Ratio: 5
Triglycerides: 289 mg/dL — ABNORMAL HIGH (ref 0.0–149.0)
VLDL: 57.8 mg/dL — ABNORMAL HIGH (ref 0.0–40.0)

## 2015-09-08 LAB — COMPREHENSIVE METABOLIC PANEL
ALT: 28 U/L (ref 0–35)
AST: 25 U/L (ref 0–37)
Albumin: 4.5 g/dL (ref 3.5–5.2)
Alkaline Phosphatase: 67 U/L (ref 39–117)
BUN: 11 mg/dL (ref 6–23)
CO2: 28 mEq/L (ref 19–32)
Calcium: 9.5 mg/dL (ref 8.4–10.5)
Chloride: 101 mEq/L (ref 96–112)
Creatinine, Ser: 0.72 mg/dL (ref 0.40–1.20)
GFR: 92.32 mL/min (ref >60.00)
Glucose, Bld: 85 mg/dL (ref 70–99)
Potassium: 3.5 mEq/L (ref 3.5–5.1)
Sodium: 140 mEq/L (ref 135–145)
Total Bilirubin: 0.4 mg/dL (ref 0.2–1.2)
Total Protein: 7.8 g/dL (ref 6.0–8.3)

## 2015-09-08 LAB — HEMOGLOBIN A1C: Hgb A1c MFr Bld: 5.6 % (ref 4.6–6.5)

## 2015-09-08 LAB — LDL CHOLESTEROL, DIRECT: Direct LDL: 115 mg/dL

## 2015-09-08 NOTE — Progress Notes (Signed)
Subjective:    Patient ID: Kayla Robles, female    DOB: [birth year], 45 y.o.   MRN: [id number]  HPI  Ms. Kayla Robles is a 45 year old female who presents today for complete physical.  Immunizations: -Tetanus: Unsure, believes it's been over 10 years. Declines. -Influenza: Does not complete them annually.  Diet: She endorses a healthy diet. Breakfast: Oatmeal, sausage burrito, orange juice, egg white sandwich (fast food) Lunch: Sandwich, soup, fruit Dinner: [place], red meat, fish, chicken, vegetables, occasional pasta Snacks: None Desserts: None Beverages: Orange juice, water, crystal light  Exercise: She does not currently exercise. Eye exam: Completes annually Dental exam: Completes semi-annually Pap Smear: Completed over three years ago, due. Mammogram: Completed several years ago, declines.   Review of Systems  Constitutional: Negative for unexpected weight change.  HENT: Negative for rhinorrhea.   Respiratory: Negative for cough and shortness of breath.   Cardiovascular: Negative for chest pain.  Gastrointestinal: Negative for constipation and diarrhea.  Genitourinary: Negative for difficulty urinating and menstrual problem.  Musculoskeletal: Negative for arthralgias and myalgias.  Skin: Negative for rash.  Allergic/Immunologic: Positive for environmental allergies.  Neurological: Negative for dizziness, numbness and headaches.  Psychiatric/Behavioral:       Overall feeling well on the Effexor, has weaned off of Alprazolam.       Past Medical History:  Diagnosis Date  . Anxiety   . Hypertension   . Kidney stones   . S/P cholecystectomy t  . S/P tubal ligation 1992     Social History   Social History  . Marital status: Married    Spouse name: N/A  . Number of children: N/A  . Years of education: N/A   Occupational History  . Not on file.   Social History Main Topics  . Smoking status: Never Smoker  . Smokeless tobacco: Not on file  . Alcohol use  No  . Drug use: No  . Sexual activity: Not on file   Other Topics Concern  . Not on file   Social History Narrative   Married.   Works as a Air traffic controllerdeputy clerk.   Enjoys playing with her grandchildren.       Past Surgical History:  Procedure Laterality Date  . CHOLECYSTECTOMY    . LITHOTRIPSY      Family History  Problem Relation Age of Onset  . Arthritis Mother   . Heart disease Mother   . Hypertension Mother   . Hypertension Father   . Diabetes Father     Allergies  Allergen Reactions  . Levaquin [Levofloxacin]     "felt weird"  . Other     Most anxiety medications  . Paroxetine Hcl   . Zithromax [Azithromycin]     Current Outpatient Prescriptions on File Prior to Visit  Medication Sig Dispense Refill  . hydrochlorothiazide (HYDRODIURIL) 12.5 MG tablet Take 1 tablet (12.5 mg total) by mouth daily. 90 tablet 1  . venlafaxine XR (EFFEXOR-XR) 37.5 MG 24 hr capsule TAKE 1 CAPSULE (37.5 MG TOTAL) BY MOUTH DAILY WITH BREAKFAST. 90 capsule 1   No current facility-administered medications on file prior to visit.     BP 134/84   Pulse 78   Temp 98.7 F (37.1 C) (Oral)   Ht 5\' 2"  (1.575 m)   Wt 209 lb (94.8 kg)   LMP 06/22/2015   SpO2 98%   BMI 38.23 kg/m    Objective:   Physical Exam  Constitutional: She is oriented to person, place, and time. She appears  well-nourished.  HENT:  Right Ear: Tympanic membrane and ear canal normal.  Left Ear: Tympanic membrane and ear canal normal.  Nose: Nose normal.  Mouth/Throat: Oropharynx is clear and moist.  Eyes: Conjunctivae and EOM are normal. Pupils are equal, round, and reactive to light.  Neck: Neck supple. No thyromegaly present.  Cardiovascular: Normal rate and regular rhythm.   No murmur heard. Pulmonary/Chest: Effort normal and breath sounds normal. She has no rales.  Abdominal: Soft. Bowel sounds are normal. There is no tenderness.  Genitourinary: There is no tenderness or lesion on the right labia. There is no  tenderness or lesion on the left labia. Cervix exhibits no motion tenderness and no discharge. Right adnexum displays no tenderness. Left adnexum displays no tenderness. No vaginal discharge found.  Musculoskeletal: Normal range of motion.  Lymphadenopathy:    She has no cervical adenopathy.  Neurological: She is alert and oriented to person, place, and time. She has normal reflexes. No cranial nerve deficit.  Skin: Skin is warm and dry. No rash noted.  Psychiatric: She has a normal mood and affect.          Assessment & Plan:

## 2015-09-08 NOTE — Assessment & Plan Note (Signed)
Stable in the office today, continue HCTZ 12.5 mg.

## 2015-09-08 NOTE — Addendum Note (Signed)
Addended by: Tawnya CrookSAMBATH, Luisdaniel Kenton on: 09/08/2015 04:31 PM   Modules accepted: Orders

## 2015-09-08 NOTE — Progress Notes (Signed)
Pre visit review using our clinic review tool, if applicable. No additional management support is needed unless otherwise documented below in the visit note. 

## 2015-09-08 NOTE — Assessment & Plan Note (Signed)
Tetanus due, patient declines. Milligrams due, patient declines. Pap due and performed today. Fair diet overall, discussed to reduce calorie consumption in the form of liquids and fast food. She will start exercising. Exam today unremarkable. Labs pending. Follow-up in one year for repeat physical.

## 2015-09-08 NOTE — Assessment & Plan Note (Signed)
Much improved with Effexor 37.5 mg and has weaned off alprazolam. Denies recent panic attacks and feels well managed.

## 2015-09-08 NOTE — Patient Instructions (Addendum)
I recommend a tetanus vaccination as you are due, please think about this.  I also recommend an annual mammogram, please notify me if you change your mind.  Start exercising. You should be getting 1 hour of moderate intensity exercise 5 days weekly.  Reduce consumption of processed carbohydrates in the form of pasta, cereal, crackers, juices, etc. Continue to consume vegetables, lean protein, fruit.  Complete lab work prior to leaving today. I will notify you of your results once received. We will have your pap results back in 2-3 days.  Follow up in 1 year for repeat annual exam or sooner if needed.  It was a pleasure to see you today!

## 2015-09-08 NOTE — Assessment & Plan Note (Signed)
History of A1c of 6.1 in the past. A1c pending today.

## 2015-09-11 LAB — CYTOLOGY - PAP

## 2015-10-27 ENCOUNTER — Ambulatory Visit (INDEPENDENT_AMBULATORY_CARE_PROVIDER_SITE_OTHER): Payer: BC Managed Care – PPO | Admitting: Family Medicine

## 2015-10-27 VITALS — BP 112/82 | HR 102 | Temp 98.1°F | Ht 62.0 in | Wt 210.6 lb

## 2015-10-27 DIAGNOSIS — J0100 Acute maxillary sinusitis, unspecified: Secondary | ICD-10-CM

## 2015-10-27 MED ORDER — AMOXICILLIN-POT CLAVULANATE 875-125 MG PO TABS: 1.0000 | ORAL_TABLET | Freq: Two times a day (BID) | ORAL | 0 refills | Status: DC

## 2015-10-27 NOTE — Progress Notes (Signed)
Pre visit review using our clinic review tool, if applicable. No additional management support is needed unless otherwise documented below in the visit note. 

## 2015-10-27 NOTE — Progress Notes (Signed)
Subjective:     Patient ID: Kayla Robles, female   DOB: 1970-02-18, 45 y.o.   MRN: [id number]  HPI Acute visit for headache, facial pain, cough. Onset of symptoms couple days ago. Patient states this past weekend she was visiting family up in [place] around lots of smoke which seems to stop her up. She's not had any purulent drainage at this time. Increased fatigue. She describes bilateral maxillary facial pressure and pain. She's taken Sudafed and over-the-counter anti-inflammatories without much relief  Past Medical History:  Diagnosis Date  . Anxiety   . Hypertension   . Kidney stones   . S/P cholecystectomy t  . S/P tubal ligation 1992   Past Surgical History:  Procedure Laterality Date  . CHOLECYSTECTOMY    . LITHOTRIPSY      reports that she has never smoked. She does not have any smokeless tobacco history on file. She reports that she does not drink alcohol or use drugs. family history includes Arthritis in her mother; Diabetes in her father; Heart disease in her mother; Hypertension in her father and mother. Allergies  Allergen Reactions  . Levaquin [Levofloxacin]     "felt weird"  . Other     Most anxiety medications  . Paroxetine Hcl   . Zithromax [Azithromycin]      Review of Systems  Constitutional: Positive for fatigue. Negative for chills and fever.  HENT: Positive for congestion.   Respiratory: Positive for cough.   Neurological: Positive for headaches.       Objective:   Physical Exam  Constitutional: She appears well-developed and well-nourished.  HENT:  Right Ear: External ear normal.  Left Ear: External ear normal.  Mouth/Throat: Oropharynx is clear and moist.  Erythematous nasal mucosa otherwise clear  Neck: Neck supple.  Cardiovascular: Normal rate and regular rhythm.  Exam reveals no gallop.   Pulmonary/Chest: Effort normal and breath sounds normal. No respiratory distress. She has no wheezes. She has no rales.  Lymphadenopathy:    She  has no cervical adenopathy.       Assessment:     Acute sinusitis.      Plan:     -As her symptoms are very acute, we recommended that she observe for now -If symptoms persist or worsen start Augmentin 875 mgs twice daily -Continue Sudafed as tolerated -Stay well-hydrated  Kristian CoveyBruce W Lovelle Lema MD [facility]

## 2015-10-27 NOTE — Patient Instructions (Signed)

## 2015-12-14 ENCOUNTER — Other Ambulatory Visit: Payer: Self-pay | Admitting: Primary Care

## 2015-12-14 DIAGNOSIS — I10 Essential (primary) hypertension: Secondary | ICD-10-CM

## 2015-12-15 ENCOUNTER — Telehealth: Payer: Self-pay

## 2015-12-15 NOTE — Telephone Encounter (Signed)
Patient needs an office visit for further evaluation.

## 2015-12-15 NOTE — Telephone Encounter (Signed)
Pt said for one week she has had vaginal burning and itching; pt has not been on abx;Pt gets these symptoms often; pt tried monistat last night but pt request difluc[facility] to [facility]. I do not see where Kayla ReelKate Clark NP has previously prescribed. Pt said she cannot come in for appt due to her work schedule and pt request med sent to pharmacy.Please advise.

## 2015-12-16 ENCOUNTER — Encounter: Payer: Self-pay | Admitting: Primary Care

## 2015-12-16 ENCOUNTER — Ambulatory Visit (INDEPENDENT_AMBULATORY_CARE_PROVIDER_SITE_OTHER): Payer: BC Managed Care – PPO | Admitting: Primary Care

## 2015-12-16 VITALS — BP 150/94 | HR 74 | Temp 98.1°F | Ht 62.0 in | Wt 213.0 lb

## 2015-12-16 DIAGNOSIS — R102 Pelvic and perineal pain: Secondary | ICD-10-CM

## 2015-12-16 MED ORDER — VALACYCLOVIR HCL 1 G PO TABS: 1000.0000 mg | ORAL_TABLET | Freq: Three times a day (TID) | ORAL | 0 refills | Status: DC

## 2015-12-16 NOTE — Progress Notes (Signed)
Pre visit review using our clinic review tool, if applicable. No additional management support is needed unless otherwise documented below in the visit note. 

## 2015-12-16 NOTE — Progress Notes (Signed)
Subjective:    Patient ID: Kayla Robles, female    DOB: 01-03-1971, 45 y.o.   MRN: [id number]  HPI  Kayla Robles is a 45 year old female who presents today with a chief complaint of vaginal discomfort. She noticed bumps to the left side of her labia early last week. She has since noticed a sharp, shooting like pain that radiates down to the left buttocks into the left leg. She's been using monistat for three days which caused vaginal burning and itching. She has noticed clear vaginal discharge and some nausea. Denies fevers, vaginal itching, urinary frequency, dysyria, hematuria.   Review of Systems  Gastrointestinal: Negative for nausea.  Genitourinary: Positive for vaginal discharge and vaginal pain. Negative for flank pain, hematuria, pelvic pain and vaginal bleeding.       Past Medical History:  Diagnosis Date  . Anxiety   . Hypertension   . Kidney stones   . S/P cholecystectomy t  . S/P tubal ligation 1992     Social History   Social History  . Marital status: Married    Spouse name: N/A  . Number of children: N/A  . Years of education: N/A   Occupational History  . Not on file.   Social History Main Topics  . Smoking status: Never Smoker  . Smokeless tobacco: Not on file  . Alcohol use No  . Drug use: No  . Sexual activity: Not on file   Other Topics Concern  . Not on file   Social History Narrative   Married.   Works as a Air traffic controllerdeputy clerk.   Enjoys playing with her grandchildren.       Past Surgical History:  Procedure Laterality Date  . CHOLECYSTECTOMY    . LITHOTRIPSY      Family History  Problem Relation Age of Onset  . Arthritis Mother   . Heart disease Mother   . Hypertension Mother   . Hypertension Father   . Diabetes Father     Allergies  Allergen Reactions  . Levaquin [Levofloxacin]     "felt weird"  . Other     Most anxiety medications  . Paroxetine Hcl   . Zithromax [Azithromycin]     Current Outpatient Prescriptions on  File Prior to Visit  Medication Sig Dispense Refill  . hydrochlorothiazide (MICROZIDE) 12.5 MG capsule TAKE 1 CAPSULE (12.5 MG TOTAL) BY MOUTH DAILY. 90 capsule 2  . venlafaxine XR (EFFEXOR-XR) 37.5 MG 24 hr capsule TAKE 1 CAPSULE (37.5 MG TOTAL) BY MOUTH DAILY WITH BREAKFAST. 90 capsule 1   No current facility-administered medications on file prior to visit.     BP (!) 150/94   Pulse 74   Temp 98.1 F (36.7 C) (Oral)   Ht 5\' 2"  (1.575 m)   Wt 213 lb (96.6 kg)   SpO2 98%   BMI 38.96 kg/m    Objective:   Physical Exam  Constitutional: She appears well-nourished.  Cardiovascular: Normal rate.   Pulmonary/Chest: Effort normal.  Genitourinary: There is no tenderness or lesion on the right labia. There is tenderness and lesion on the left labia. Cervix exhibits discharge. Cervix exhibits no motion tenderness. There is tenderness in the vagina. Vaginal discharge found.  Genitourinary Comments: Mild clear vaginal discharge, no foul odor. Numerous small bumps to left labia majora representative of herpes zoster.          Assessment & Plan:  Vaginal Pain:  Also with clear vaginal discharge, shock like shooting pain radiating from vagina.  Exam with multiple closed vesicles representative of herpes zoster. Left labia majora tender upon palpation.  Given shock-like pain coupled with herpes zoster lesions, will treat for presumed vaginal lesions. Rx for Valtrex 1 mg TID x 7 days. Discussed that if vesicals were to open then she would be contagious.  She declines pain medication, will try tylenol/ibuprofen. Return precautions provided.  Morrie Sheldonlark,Cory Rama Kendal, NP

## 2015-12-16 NOTE — Patient Instructions (Signed)
Start Valtrex 1000 mg tablets. Take 1 tablet by mouth three times daily for 7 days.  You are not contagious unless your sores open.   Please call me if no improvement by Monday next week.  You may take tylenol or ibuprofen as needed for pain.  It was a pleasure to see you today!

## 2015-12-16 NOTE — Telephone Encounter (Signed)
Patient has an appointment on 12/16/2015.

## 2015-12-22 ENCOUNTER — Telehealth: Payer: Self-pay | Admitting: Primary Care

## 2015-12-22 NOTE — Telephone Encounter (Signed)
Spoken and notified patient of [name]'s comments. Patient verbalized understanding. 

## 2015-12-22 NOTE — Telephone Encounter (Signed)
Noted. Please call and thank patient for update. We are glad to hear she's doing better.

## 2015-12-22 NOTE — Telephone Encounter (Signed)
Pt called to give update. Only 1 blister left, still a little fatigued but back at work. She feels much better than last wk. She is finishing the meds.

## 2015-12-22 NOTE — Telephone Encounter (Signed)
Message left for patient to return my call.  

## 2016-01-05 ENCOUNTER — Encounter: Payer: Self-pay | Admitting: Primary Care

## 2016-01-05 ENCOUNTER — Ambulatory Visit (INDEPENDENT_AMBULATORY_CARE_PROVIDER_SITE_OTHER): Payer: BC Managed Care – PPO | Admitting: Primary Care

## 2016-01-05 VITALS — BP 148/88 | HR 96 | Temp 98.2°F | Wt 214.0 lb

## 2016-01-05 DIAGNOSIS — F411 Generalized anxiety disorder: Secondary | ICD-10-CM

## 2016-01-05 MED ORDER — VENLAFAXINE HCL ER 75 MG PO CP24: 75.0000 mg | ORAL_CAPSULE | Freq: Every day | ORAL | 0 refills | Status: DC

## 2016-01-05 NOTE — Progress Notes (Signed)
Subjective:    Patient ID: Kayla Robles, female    DOB: Feb 02, 1970, 45 y.o.   MRN: [id number]  HPI  Ms. Kayla Robles is a 45 year old female who presents today with a chief complaint of stress.   She was diagnosed and treated for Herpes Zoster several weeks ago. She's always had a tough time with stress management most of her life. Over the last year she's been under a lot of stress. 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   Since treatment for Herpes Zoster she's noticed the return of several bumps to the left labia. Overall they're not painful and she thinks they've reduced over the weekend. She is managed on Effexor XR 37.5 mg for GAD which helps to reduce anxiety and prevents panic attacks. GAD 7 score of 11 today.  Wt Readings from Last 3 Encounters:  01/05/16 214 lb (97.1 kg)  12/16/15 213 lb (96.6 kg)  10/27/15 210 lb 9.6 oz (95.5 kg)     Review of Systems  Respiratory: Negative for shortness of breath.   Cardiovascular: Negative for chest pain.  Genitourinary: Negative for vaginal pain.  Skin: Positive for rash.  Psychiatric/Behavioral: Negative for sleep disturbance. The patient is nervous/anxious.        Past Medical History:  Diagnosis Date  . Anxiety   . Hypertension   . Kidney stones   . S/P cholecystectomy t  . S/P tubal ligation 1992     Social History   Social History  . Marital status: Married    Spouse name: N/A  . Number of children: N/A  . Years of education: N/A   Occupational History  . Not on file.   Social History Main Topics  . Smoking status: Never Smoker  . Smokeless tobacco: Not on file  . Alcohol use No  . Drug use: No  . Sexual activity: Not on file   Other Topics Concern  . Not on file   Social History Narrative   Married.   Works as a Air traffic controllerdeputy clerk.   Enjoys playing with her grandchildren.       Past Surgical History:  Procedure Laterality Date  .  CHOLECYSTECTOMY    . LITHOTRIPSY      Family History  Problem Relation Age of Onset  . Arthritis Mother   . Heart disease Mother   . Hypertension Mother   . Hypertension Father   . Diabetes Father     Allergies  Allergen Reactions  . Levaquin [Levofloxacin]     "felt weird"  . Other     Most anxiety medications  . Paroxetine Hcl   . Zithromax [Azithromycin]     Current Outpatient Prescriptions on File Prior to Visit  Medication Sig Dispense Refill  . hydrochlorothiazide (MICROZIDE) 12.5 MG capsule TAKE 1 CAPSULE (12.5 MG TOTAL) BY MOUTH DAILY. 90 capsule 2   No current facility-administered medications on file prior to visit.     BP (!) 148/88   Pulse 96   Temp 98.2 F (36.8 C) (Oral)   Wt 214 lb (97.1 kg)   LMP 01/04/2016   BMI 39.14 kg/m    Objective:   Physical Exam  Constitutional: She appears well-nourished.  Neck: Neck supple.  Cardiovascular: Normal rate and regular rhythm.   Pulmonary/Chest: Effort normal and breath sounds normal.  Skin: Skin is warm and dry.  Psychiatric: She has a normal mood and affect.  Assessment & Plan:

## 2016-01-05 NOTE — Assessment & Plan Note (Signed)
Overall does well on Effexor. GAD 7 score of 11 today. Discussed options for stress management including therapy and increase in Effexor dose. Will start with increase in Effexor dose to help with anxiety and stress at work. New Rx sent to pharmacy. Will call her in 3 weeks for an update.

## 2016-01-05 NOTE — Patient Instructions (Signed)
We've increased the dose of your Effexor XR (venlavaxine) from 37.5 mg to 75 mg. You may take two of the 37.5 mg tablets until your current bottle is empty. I sent a new prescription of Effexor XR (venlafaxine) 75 mg to your pharmacy. Take 1 tablet by mouth once daily.  Please notify me if no improvement in stress/anxiety in 4 weeks.  It was a pleasure to see you today!

## 2016-01-05 NOTE — Progress Notes (Signed)
Pre visit review using our clinic review tool, if applicable. No additional management support is needed unless otherwise documented below in the visit note. 

## 2016-01-27 ENCOUNTER — Telehealth: Payer: Self-pay | Admitting: Primary Care

## 2016-01-27 NOTE — Telephone Encounter (Signed)
Message left for patient to return my call.  

## 2016-01-27 NOTE — Telephone Encounter (Signed)
-----   Message from Doreene NestKatherine K Tidus Upchurch, NP sent at 01/05/2016 10:19 AM EST ----- Regarding: Anxiety How's her stress/anxiety since we increased her Effexor dose from 37.5 mg to 75 mg?

## 2016-01-28 NOTE — Telephone Encounter (Signed)
Message left for patient to return my call.  

## 2016-01-29 NOTE — Telephone Encounter (Signed)
Pt returned your call. Pt is doing better. No concerns with meds. Office [phone].

## 2016-01-30 NOTE — Telephone Encounter (Signed)
Noted and glad to hear! 

## 2016-01-30 NOTE — Telephone Encounter (Signed)
Tried to call patient but will forward message to [facility]

## 2016-03-01 ENCOUNTER — Ambulatory Visit (INDEPENDENT_AMBULATORY_CARE_PROVIDER_SITE_OTHER): Payer: BC Managed Care – PPO | Admitting: Primary Care

## 2016-03-01 ENCOUNTER — Encounter: Payer: Self-pay | Admitting: Primary Care

## 2016-03-01 VITALS — BP 138/86 | HR 72 | Temp 97.8°F | Ht 62.0 in | Wt 214.0 lb

## 2016-03-01 DIAGNOSIS — R300 Dysuria: Secondary | ICD-10-CM

## 2016-03-01 LAB — POC URINALSYSI DIPSTICK (AUTOMATED)
Bilirubin, UA: NEGATIVE
Glucose, UA: NEGATIVE
Ketones, UA: NEGATIVE
Leukocytes, UA: NEGATIVE
Nitrite, UA: NEGATIVE
Protein, UA: NEGATIVE
Spec Grav, UA: 1.025
Urobilinogen, UA: NEGATIVE
pH, UA: 6

## 2016-03-01 NOTE — Progress Notes (Signed)
Subjective:    Patient ID: Kayla Robles, female    DOB: 1970/07/08, 46 y.o.   MRN: [id number]  HPI  Kayla Robles is a 46 year old female who presents today with multiple complaints.  1) Abdominal pain: Also with dysuria, lower back pain, chills, headaches, foul smelling urine, fatigue, nausea. Her symptoms began yesterday after eating lunch. Her abdominal pain is located to the bilateral mid abdomen. She denies vomiting, increased urinary frequency, hematuria. She ate a small dinner last night as her appetite was decreased. She's feeling "yucky". She's been taking tylenol OTC for her symptoms.   2) Ear Pain: Located to left ear that has been present intermittently for the past several weeks. [facility]. She denies fevers, sore throat, cough.   Review of Systems  Constitutional: Positive for fatigue. Negative for chills and fever.  HENT: Positive for ear pain. Negative for congestion.   Respiratory: Negative for cough.   Gastrointestinal: Positive for abdominal pain and nausea. Negative for blood in stool, diarrhea and vomiting.  Genitourinary: Positive for dysuria and frequency. Negative for difficulty urinating, flank pain, hematuria and vaginal discharge.  Musculoskeletal: Positive for back pain.  Neurological: Positive for headaches.       Past Medical History:  Diagnosis Date  . Anxiety   . Hypertension   . Kidney stones   . S/P cholecystectomy t  . S/P tubal ligation 1992     Social History   Social History  . Marital status: Married    Spouse name: N/A  . Number of children: N/A  . Years of education: N/A   Occupational History  . Not on file.   Social History Main Topics  . Smoking status: Never Smoker  . Smokeless tobacco: Never Used  . Alcohol use No  . Drug use: No  . Sexual activity: Not on file   Other Topics Concern  . Not on file   Social History Narrative   Married.   Works as a Air traffic controllerdeputy clerk.   Enjoys playing with her  grandchildren.       Past Surgical History:  Procedure Laterality Date  . CHOLECYSTECTOMY    . LITHOTRIPSY      Family History  Problem Relation Age of Onset  . Arthritis Mother   . Heart disease Mother   . Hypertension Mother   . Hypertension Father   . Diabetes Father     Allergies  Allergen Reactions  . Levaquin [Levofloxacin]     "felt weird"  . Other     Most anxiety medications  . Paroxetine Hcl   . Zithromax [Azithromycin]     Current Outpatient Prescriptions on File Prior to Visit  Medication Sig Dispense Refill  . hydrochlorothiazide (MICROZIDE) 12.5 MG capsule TAKE 1 CAPSULE (12.5 MG TOTAL) BY MOUTH DAILY. 90 capsule 2  . venlafaxine XR (EFFEXOR XR) 75 MG 24 hr capsule Take 1 capsule (75 mg total) by mouth daily with breakfast. 90 capsule 0   No current facility-administered medications on file prior to visit.     BP 138/86   Pulse 72   Temp 97.8 F (36.6 C) (Oral)   Ht 5\' 2"  (1.575 m)   Wt 214 lb (97.1 kg)   LMP 02/10/2016   SpO2 97%   BMI 39.14 kg/m    Objective:   Physical Exam  Constitutional: She appears well-nourished. She does not appear ill.  HENT:  Right Ear: Tympanic membrane and ear canal normal.  Left Ear:  Ear canal normal.  Nose: Right sinus exhibits no maxillary sinus tenderness and no frontal sinus tenderness. Left sinus exhibits no maxillary sinus tenderness and no frontal sinus tenderness.  Mouth/Throat: Oropharynx is clear and moist.  Dullness to left TM, no acute signs of otitis externa or media  Eyes: Conjunctivae are normal.  Neck: Neck supple.  Cardiovascular: Normal rate and regular rhythm.   Pulmonary/Chest: Effort normal and breath sounds normal. She has no wheezes. She has no rales.  Abdominal: Soft.  Not tender, but uncomfortable with bilateral mid abdomen exam  Lymphadenopathy:    She has no cervical adenopathy.  Skin: Skin is warm and dry.          Assessment & Plan:  Abdominal Pain:  Present for the  past 20 hours.  Symptoms sound to be early viral involvement given nausea, fatigue, headaches, abdominal discomfort. Exam today stable, she does not appear sickly or dehydrated. Discussed trajectory of the viral GI illness and notified to stay hydrated and use anti-nausea medication. UA: Trace blood, evident on prior UA's. No Leuks or nitrites.  Return precautions provided.  Ear Pain:  Located to left ear intermittently. Suspect secondary to swimming.  Exam today without evidence for otitis media or externa. Discussed use of Flonase, may need ear plugs when swimming. Follow up PRN.  Morrie Sheldon, NP

## 2016-03-01 NOTE — Progress Notes (Signed)
Pre visit review using our clinic review tool, if applicable. No additional management support is needed unless otherwise documented below in the visit note. 

## 2016-03-01 NOTE — Patient Instructions (Signed)
You may be getting a viral illness in the stomach. You may experience fevers, diarrhea, vomiting. You may feel worse over the next 24-48 hours.  You may take the anti-nausea medication as needed.  Ensure you are staying hydrated with water and rest.   Please call me Friday this week if no improvement.  It was a pleasure to see you today!

## 2016-03-25 ENCOUNTER — Ambulatory Visit (INDEPENDENT_AMBULATORY_CARE_PROVIDER_SITE_OTHER): Payer: BC Managed Care – PPO | Admitting: Primary Care

## 2016-03-25 ENCOUNTER — Encounter: Payer: Self-pay | Admitting: Primary Care

## 2016-03-25 DIAGNOSIS — F411 Generalized anxiety disorder: Secondary | ICD-10-CM | POA: Diagnosis not present

## 2016-03-25 DIAGNOSIS — I10 Essential (primary) hypertension: Secondary | ICD-10-CM | POA: Diagnosis not present

## 2016-03-25 MED ORDER — VENLAFAXINE HCL ER 75 MG PO CP24: 150.0000 mg | ORAL_CAPSULE | Freq: Every day | ORAL | 1 refills | Status: DC

## 2016-03-25 NOTE — Assessment & Plan Note (Signed)
Noted to be above goal today, and also on prior visits. Today with increased anxiety and stress, we'll continue to monitor blood pressure.

## 2016-03-25 NOTE — Assessment & Plan Note (Signed)
Improvement in symptoms from increased dose of Effexor in December, however, increased symptoms of anxiety over several months. Long discussion today regarding treatment options. Will increase dose of Effexor to 150 mg daily, referral placed to therapy for treatment. Close follow-up in 6 weeks.

## 2016-03-25 NOTE — Patient Instructions (Signed)
We've increased your Effexor from 75 mg to 150 mg. Take 2 capsules by mouth every morning with breakfast.  Stop by the front desk and speak with either Shirlee LimerickMarion or Revonda StandardAllison regarding your referral to therapy.  Please schedule a follow up appointment in 6 weeks for re-evaluation.  It was a pleasure to see you today!

## 2016-03-25 NOTE — Progress Notes (Signed)
Pre visit review using our clinic review tool, if applicable. No additional management support is needed unless otherwise documented below in the visit note. 

## 2016-03-25 NOTE — Progress Notes (Signed)
Subjective:    Patient ID: Kayla Robles, female    DOB: 1970/06/01, 46 y.o.   MRN: [id number]  HPI  Ms. Kayla Robles is a 46 year old female with a history of GAD who presents today with a chief complaint of anxiety and depression. She is currently managed on Effexor XR 75 mg with an increase in dose in December 2017. She was previously managed on Xanax which was discontinued quite some time ago.   Over the past several months she's felt symptoms of anxiousness, nausea, diarrhea, difficulty concentrating, irritability. She's under a lot of stress, with work, recent family death, family stress as she may be moving her mother in with her, financial strain, stress at [employer]. She feels like she's on the verge of breaking down. GAD 7 score of 17.  Her dose increase of Effexor to 75 mg was helpful for a while until she started experiencing increased stress. She denies SI/HI.  Review of Systems  Constitutional: Positive for fatigue.  Respiratory: Positive for chest tightness.   Cardiovascular: Positive for palpitations. Negative for chest pain.  Psychiatric/Behavioral: Negative for sleep disturbance and suicidal ideas. The patient is nervous/anxious.        Feeling down.         Past Medical History:  Diagnosis Date  . Anxiety   . Hypertension   . Kidney stones   . S/P cholecystectomy t  . S/P tubal ligation 1992     Social History   Social History  . Marital status: Married    Spouse name: N/A  . Number of children: N/A  . Years of education: N/A   Occupational History  . Not on file.   Social History Main Topics  . Smoking status: Never Smoker  . Smokeless tobacco: Never Used  . Alcohol use No  . Drug use: No  . Sexual activity: Not on file   Other Topics Concern  . Not on file   Social History Narrative   Married.   Works as a Air traffic controllerdeputy clerk.   Enjoys playing with her grandchildren.       Past Surgical History:  Procedure Laterality Date  . CHOLECYSTECTOMY      . LITHOTRIPSY      Family History  Problem Relation Age of Onset  . Arthritis Mother   . Heart disease Mother   . Hypertension Mother   . Hypertension Father   . Diabetes Father     Allergies  Allergen Reactions  . Levaquin [Levofloxacin]     "felt weird"  . Other     Most anxiety medications  . Paroxetine Hcl   . Zithromax [Azithromycin]     Current Outpatient Prescriptions on File Prior to Visit  Medication Sig Dispense Refill  . hydrochlorothiazide (MICROZIDE) 12.5 MG capsule TAKE 1 CAPSULE (12.5 MG TOTAL) BY MOUTH DAILY. 90 capsule 2   No current facility-administered medications on file prior to visit.     BP (!) 144/88   Pulse 89   Temp 98.2 F (36.8 C) (Oral)   Ht 5\' 2"  (1.575 m)   Wt 215 lb (97.5 kg)   LMP 03/12/2016   SpO2 98%   BMI 39.32 kg/m    Objective:   Physical Exam  Constitutional: She appears well-nourished.  Neck: Neck supple.  Cardiovascular: Normal rate and regular rhythm.   Pulmonary/Chest: Effort normal and breath sounds normal.  Skin: Skin is warm and dry.  Psychiatric:  Tearful during exam today.  Assessment & Plan:

## 2016-04-01 ENCOUNTER — Other Ambulatory Visit: Payer: Self-pay | Admitting: Primary Care

## 2016-04-01 DIAGNOSIS — F411 Generalized anxiety disorder: Secondary | ICD-10-CM

## 2016-04-15 ENCOUNTER — Ambulatory Visit (INDEPENDENT_AMBULATORY_CARE_PROVIDER_SITE_OTHER): Payer: BC Managed Care – PPO | Admitting: Psychology

## 2016-04-15 DIAGNOSIS — F4323 Adjustment disorder with mixed anxiety and depressed mood: Secondary | ICD-10-CM | POA: Diagnosis not present

## 2016-05-05 ENCOUNTER — Ambulatory Visit (INDEPENDENT_AMBULATORY_CARE_PROVIDER_SITE_OTHER): Payer: BC Managed Care – PPO | Admitting: Primary Care

## 2016-05-05 ENCOUNTER — Encounter: Payer: Self-pay | Admitting: Primary Care

## 2016-05-05 DIAGNOSIS — I10 Essential (primary) hypertension: Secondary | ICD-10-CM | POA: Diagnosis not present

## 2016-05-05 DIAGNOSIS — F411 Generalized anxiety disorder: Secondary | ICD-10-CM

## 2016-05-05 NOTE — Progress Notes (Signed)
Subjective:    Patient ID: Kayla Robles, female    DOB: March 07, 1970, 46 y.o.   MRN: [id number]  HPI  Kayla Robles is a 46 year old female who presents today for follow up.  1) GAD: She presented in early March with symptoms of anxiety, irritability, difficulty concentrating, and nausea. At that time she was going through a lot of stress both at work and with her family. Her dose of Effexor was increased to Effexor 150 mg.   Since her last visit she's feeling moderately better. Her responsibilities at work have been lowered at work which has helped with anxiety/stress in the work place. Her personal life has improved. Her irritability and nervousness has improved. She has met with her therapist once and felt that this was helpful. She is due to see her next week.   2) Essential Hypertension: Currently managed on HCTZ 12.5 mg. During her last and prior visits her BP was noted to be above goal. She was anxious last visit so she was encouraged to monitor her BP and follow up today. Her BP in the office today is 138/82. She's not been checking her blood pressure at home. She denies chest pain, dizziness, shortness of breath. She is not exercising and endorses a poor diet.  Review of Systems  Constitutional: Negative for fatigue.  Eyes: Negative for visual disturbance.  Respiratory: Negative for shortness of breath.   Cardiovascular: Negative for chest pain.  Neurological: Negative for dizziness.  Psychiatric/Behavioral: Negative for sleep disturbance and suicidal ideas.       Anxiety improved, see HPI       Past Medical History:  Diagnosis Date  . Anxiety   . Hypertension   . Kidney stones   . S/P cholecystectomy t  . S/P tubal ligation 1992     Social History   Social History  . Marital status: Married    Spouse name: N/A  . Number of children: N/A  . Years of education: N/A   Occupational History  . Not on file.   Social History Main Topics  . Smoking status: Never  Smoker  . Smokeless tobacco: Never Used  . Alcohol use No  . Drug use: No  . Sexual activity: Not on file   Other Topics Concern  . Not on file   Social History Narrative   Married.   Works as a Electrical engineer.   Enjoys playing with her grandchildren.       Past Surgical History:  Procedure Laterality Date  . CHOLECYSTECTOMY    . LITHOTRIPSY      Family History  Problem Relation Age of Onset  . Arthritis Mother   . Heart disease Mother   . Hypertension Mother   . Hypertension Father   . Diabetes Father     Allergies  Allergen Reactions  . Levaquin [Levofloxacin]     "felt weird"  . Other     Most anxiety medications  . Paroxetine Hcl   . Zithromax [Azithromycin]     Current Outpatient Prescriptions on File Prior to Visit  Medication Sig Dispense Refill  . hydrochlorothiazide (MICROZIDE) 12.5 MG capsule TAKE 1 CAPSULE (12.5 MG TOTAL) BY MOUTH DAILY. 90 capsule 2  . venlafaxine XR (EFFEXOR XR) 75 MG 24 hr capsule Take 2 capsules (150 mg total) by mouth daily with breakfast. 60 capsule 1   No current facility-administered medications on file prior to visit.     BP 138/82   Pulse 81   Temp  98.3 F (36.8 C) (Oral)   Wt 217 lb 12 oz (98.8 kg)   SpO2 97%   BMI 39.83 kg/m    Objective:   Physical Exam  Constitutional: She appears well-nourished.  Neck: Neck supple.  Cardiovascular: Normal rate and regular rhythm.   Pulmonary/Chest: Effort normal and breath sounds normal.  Skin: Skin is warm and dry.  Psychiatric: She has a normal mood and affect.  Mood much improved since last visit.          Assessment & Plan:

## 2016-05-05 NOTE — Progress Notes (Signed)
Pre visit review using our clinic review tool, if applicable. No additional management support is needed unless otherwise documented below in the visit note. 

## 2016-05-05 NOTE — Patient Instructions (Signed)
Continue to take Effexor two capsules daily for anxiety.  Monitor your blood pressure and notify me of readings consistently at or above 135/90.  Start exercising. You should be getting 150 minutes of moderate intensity exercise weekly.  It's important to improve your diet by reducing consumption of fast food, fried food, processed snack foods, sugary drinks. Increase consumption of fresh vegetables and fruits, whole grains, water.  Ensure you are drinking 64 ounces of water daily.  Please schedule a physical with me in August/September this year. You may also schedule a lab only appointment 3-4 days prior. We will discuss your lab results in detail during your physical.  It was a pleasure to see you today!

## 2016-05-05 NOTE — Assessment & Plan Note (Signed)
Much improved since increase in dose of Effexor.  Will have her continue the current dose and seeing her therapist.  Follow up later this Summer during her physical.

## 2016-05-05 NOTE — Assessment & Plan Note (Signed)
Improved today. Will have her work on weight loss through healthy diet and exercise.  Continue to monitor BP at home and in the office. If above goal during physical this Summer then will consider increasing dose.

## 2016-05-13 ENCOUNTER — Ambulatory Visit (INDEPENDENT_AMBULATORY_CARE_PROVIDER_SITE_OTHER): Payer: BC Managed Care – PPO | Admitting: Psychology

## 2016-05-13 DIAGNOSIS — F4323 Adjustment disorder with mixed anxiety and depressed mood: Secondary | ICD-10-CM

## 2016-05-20 ENCOUNTER — Other Ambulatory Visit: Payer: Self-pay | Admitting: Primary Care

## 2016-05-20 DIAGNOSIS — F411 Generalized anxiety disorder: Secondary | ICD-10-CM

## 2016-05-31 ENCOUNTER — Ambulatory Visit (INDEPENDENT_AMBULATORY_CARE_PROVIDER_SITE_OTHER): Payer: BC Managed Care – PPO | Admitting: Primary Care

## 2016-05-31 ENCOUNTER — Telehealth: Payer: Self-pay | Admitting: *Deleted

## 2016-05-31 ENCOUNTER — Encounter: Payer: Self-pay | Admitting: Primary Care

## 2016-05-31 DIAGNOSIS — I10 Essential (primary) hypertension: Secondary | ICD-10-CM

## 2016-05-31 DIAGNOSIS — F411 Generalized anxiety disorder: Secondary | ICD-10-CM

## 2016-05-31 NOTE — Assessment & Plan Note (Signed)
Increase HCTZ to 25 mg once daily. Will have her monitor home readings. Will call her in 2 weeks for BP readings, she will call sooner if no improvement in 1 week.

## 2016-05-31 NOTE — Progress Notes (Signed)
   Subjective:    Patient ID: Kayla Robles, female    DOB: 1970-08-07, 46 y.o.   MRN: [id number]  HPI  Kayla Robles is a 46 year old female with a history of anxiety disorder and hypertension who presents today with a chief complaint of hypertension. She is currently managed on HCTZ 12.5 mg once daily.   She called and left a message with our triage system reporting elevated blood pressure readings over the weekend, 140's/low 100's this morning. Her BP during prior office visits has been borderline. She has checked her BP intermittently for the past several weeks which is running 130's/90's. Her BP in the office today is 136/84. She has noticed headaches since the elevation in blood pressure.   Review of Systems  Constitutional: Negative for fatigue.  Eyes: Negative for visual disturbance.  Respiratory: Negative for shortness of breath.   Cardiovascular: Negative for chest pain.  Neurological: Positive for headaches. Negative for dizziness.       Past Medical History:  Diagnosis Date  . Anxiety   . Hypertension   . Kidney stones   . S/P cholecystectomy t  . S/P tubal ligation 1992     Social History   Social History  . Marital status: Married    Spouse name: N/A  . Number of children: N/A  . Years of education: N/A   Occupational History  . Not on file.   Social History Main Topics  . Smoking status: Never Smoker  . Smokeless tobacco: Never Used  . Alcohol use No  . Drug use: No  . Sexual activity: Not on file   Other Topics Concern  . Not on file   Social History Narrative   Married.   Works as a Air traffic controllerdeputy clerk.   Enjoys playing with her grandchildren.       Past Surgical History:  Procedure Laterality Date  . CHOLECYSTECTOMY    . LITHOTRIPSY      Family History  Problem Relation Age of Onset  . Arthritis Mother   . Heart disease Mother   . Hypertension Mother   . Hypertension Father   . Diabetes Father     Allergies  Allergen Reactions  .  Levaquin [Levofloxacin]     "felt weird"  . Other     Most anxiety medications  . Paroxetine Hcl   . Zithromax [Azithromycin]     Current Outpatient Prescriptions on File Prior to Visit  Medication Sig Dispense Refill  . hydrochlorothiazide (MICROZIDE) 12.5 MG capsule TAKE 1 CAPSULE (12.5 MG TOTAL) BY MOUTH DAILY. 90 capsule 2  . venlafaxine XR (EFFEXOR-XR) 75 MG 24 hr capsule TAKE 2 CAPSULES (150 MG TOTAL) BY MOUTH DAILY WITH BREAKFAST. (Patient taking differently: Take 75 mg by mouth daily with breakfast. ) 60 capsule 1   No current facility-administered medications on file prior to visit.     BP 136/84   Pulse 79   Temp 98.5 F (36.9 C) (Oral)   Ht 5\' 2"  (1.575 m)   Wt 217 lb (98.4 kg)   SpO2 97%   BMI 39.69 kg/m    Objective:   Physical Exam  Constitutional: She appears well-nourished.  Neck: Neck supple.  Cardiovascular: Normal rate and regular rhythm.   Pulmonary/Chest: Effort normal and breath sounds normal.  Skin: Skin is warm and dry.          Assessment & Plan:

## 2016-05-31 NOTE — Patient Instructions (Signed)
We've increased your blood pressure medication to 25 mg. You may take two of the 12.5 mg capsules of the hydrochlorothiazide for now.  I will call you in two weeks for your blood pressure readings. Please call me in 1 week if you've not noticed improvement.  Continue to work on reduction of sodium and weight loss.  It was a pleasure to see you today!   DASH Eating Plan DASH stands for "Dietary Approaches to Stop Hypertension." The DASH eating plan is a healthy eating plan that has been shown to reduce high blood pressure (hypertension). It may also reduce your risk for type 2 diabetes, heart disease, and stroke. The DASH eating plan may also help with weight loss. What are tips for following this plan? General guidelines   Avoid eating more than 2,300 mg (milligrams) of salt (sodium) a day. If you have hypertension, you may need to reduce your sodium intake to 1,500 mg a day.  Limit alcohol intake to no more than 1 drink a day for nonpregnant women and 2 drinks a day for men. One drink equals 12 oz of beer, 5 oz of wine, or 1 oz of hard liquor.  Work with your health care provider to maintain a healthy body weight or to lose weight. Ask what an ideal weight is for you.  Get at least 30 minutes of exercise that causes your heart to beat faster (aerobic exercise) most days of the week. Activities may include walking, swimming, or biking.  Work with your health care provider or diet and nutrition specialist (dietitian) to adjust your eating plan to your individual calorie needs. Reading food labels   Check food labels for the amount of sodium per serving. Choose foods with less than 5 percent of the Daily Value of sodium. Generally, foods with less than 300 mg of sodium per serving fit into this eating plan.  To find whole grains, look for the word "whole" as the first word in the ingredient list. Shopping   Buy products labeled as "low-sodium" or "no salt added."  Buy fresh foods.  Avoid canned foods and premade or frozen meals. Cooking   Avoid adding salt when cooking. Use salt-free seasonings or herbs instead of table salt or sea salt. Check with your health care provider or pharmacist before using salt substitutes.  Do not fry foods. Cook foods using healthy methods such as baking, boiling, grilling, and broiling instead.  Cook with heart-healthy oils, such as olive, canola, soybean, or sunflower oil. Meal planning    Eat a balanced diet that includes:  5 or more servings of fruits and vegetables each day. At each meal, try to fill half of your plate with fruits and vegetables.  Up to 6-8 servings of whole grains each day.  Less than 6 oz of lean meat, poultry, or fish each day. A 3-oz serving of meat is about the same size as a deck of cards. One egg equals 1 oz.  2 servings of low-fat dairy each day.  A serving of nuts, seeds, or beans 5 times each week.  Heart-healthy fats. Healthy fats called Omega-3 fatty acids are found in foods such as flaxseeds and coldwater fish, like sardines, salmon, and mackerel.  Limit how much you eat of the following:  Canned or prepackaged foods.  Food that is high in trans fat, such as fried foods.  Food that is high in saturated fat, such as fatty meat.  Sweets, desserts, sugary drinks, and other foods with added  sugar.  Full-fat dairy products.  Do not salt foods before eating.  Try to eat at least 2 vegetarian meals each week.  Eat more home-cooked food and less restaurant, buffet, and fast food.  When eating at a restaurant, ask that your food be prepared with less salt or no salt, if possible. What foods are recommended? The items listed may not be a complete list. Talk with your dietitian about what dietary choices are best for you. Grains  Whole-grain or whole-wheat bread. Whole-grain or whole-wheat pasta. Brown rice. Orpah Cobb. Bulgur. Whole-grain and low-sodium cereals. Pita bread. Low-fat,  low-sodium crackers. Whole-wheat flour tortillas. Vegetables  Fresh or frozen vegetables (raw, steamed, roasted, or grilled). Low-sodium or reduced-sodium tomato and vegetable juice. Low-sodium or reduced-sodium tomato sauce and tomato paste. Low-sodium or reduced-sodium canned vegetables. Fruits  All fresh, dried, or frozen fruit. Canned fruit in natural juice (without added sugar). Meat and other protein foods  Skinless chicken or [place]. Ground chicken or [place]. Pork with fat trimmed off. Fish and seafood. Egg whites. Dried beans, peas, or lentils. Unsalted nuts, nut butters, and seeds. Unsalted canned beans. Lean cuts of beef with fat trimmed off. Low-sodium, lean deli meat. Dairy  Low-fat (1%) or fat-free (skim) milk. Fat-free, low-fat, or reduced-fat cheeses. Nonfat, low-sodium ricotta or cottage cheese. Low-fat or nonfat yogurt. Low-fat, low-sodium cheese. Fats and oils  Soft margarine without trans fats. Vegetable oil. Low-fat, reduced-fat, or light mayonnaise and salad dressings (reduced-sodium). Canola, safflower, olive, soybean, and sunflower oils. Avocado. Seasoning and other foods  Herbs. Spices. Seasoning mixes without salt. Unsalted popcorn and pretzels. Fat-free sweets. What foods are not recommended? The items listed may not be a complete list. Talk with your dietitian about what dietary choices are best for you. Grains  Baked goods made with fat, such as croissants, muffins, or some breads. Dry pasta or rice meal packs. Vegetables  Creamed or fried vegetables. Vegetables in a cheese sauce. Regular canned vegetables (not low-sodium or reduced-sodium). Regular canned tomato sauce and paste (not low-sodium or reduced-sodium). Regular tomato and vegetable juice (not low-sodium or reduced-sodium). Rosita Fire. Olives. Fruits  Canned fruit in a light or heavy syrup. Fried fruit. Fruit in cream or butter sauce. Meat and other protein foods  Fatty cuts of meat. Ribs. Fried meat. Tomasa Blase.  Sausage. Bologna and other processed lunch meats. Salami. Fatback. Hotdogs. Bratwurst. Salted nuts and seeds. Canned beans with added salt. Canned or smoked fish. Whole eggs or egg yolks. Chicken or [place] with skin. Dairy  Whole or 2% milk, cream, and half-and-half. Whole or full-fat cream cheese. Whole-fat or sweetened yogurt. Full-fat cheese. Nondairy creamers. Whipped toppings. Processed cheese and cheese spreads. Fats and oils  Butter. Stick margarine. Lard. Shortening. Ghee. Bacon fat. Tropical oils, such as coconut, palm kernel, or palm oil. Seasoning and other foods  Salted popcorn and pretzels. Onion salt, garlic salt, seasoned salt, table salt, and sea salt. Worcestershire sauce. Tartar sauce. Barbecue sauce. Teriyaki sauce. Soy sauce, including reduced-sodium. Steak sauce. Canned and packaged gravies. Fish sauce. Oyster sauce. Cocktail sauce. Horseradish that you find on the shelf. Ketchup. Mustard. Meat flavorings and tenderizers. Bouillon cubes. Hot sauce and Tabasco sauce. Premade or packaged marinades. Premade or packaged taco seasonings. Relishes. Regular salad dressings. Where to find more information:  National Heart, Lung, and Blood Institute: PopSteam.is  American Heart Association: www.heart.org Summary  The DASH eating plan is a healthy eating plan that has been shown to reduce high blood pressure (hypertension). It may also reduce your  risk for type 2 diabetes, heart disease, and stroke.  With the DASH eating plan, you should limit salt (sodium) intake to 2,300 mg a day. If you have hypertension, you may need to reduce your sodium intake to 1,500 mg a day.  When on the DASH eating plan, aim to eat more fresh fruits and vegetables, whole grains, lean proteins, low-fat dairy, and heart-healthy fats.  Work with your health care provider or diet and nutrition specialist (dietitian) to adjust your eating plan to your individual calorie needs. This information is not  intended to replace advice given to you by your health care provider. Make sure you discuss any questions you have with your health care provider. Document Released: 12/31/2010 Document Revised: 01/05/2016 Document Reviewed: 01/05/2016 Elsevier Interactive Patient Education  2017 [employer]

## 2016-05-31 NOTE — Progress Notes (Signed)
Pre visit review using our clinic review tool, if applicable. No additional management support is needed unless otherwise documented below in the visit note. 

## 2016-05-31 NOTE — Telephone Encounter (Signed)
Noted. Patient already schedule appointment to be seen on 05/31/2016

## 2016-05-31 NOTE — Telephone Encounter (Signed)
Spoke to pt who states her BP has been elevated over the weekend. This am 144/102 before she got out of bed. Pt was sched today at 1545, but is wanting to know if she is needing to be seen. Pt is not symptomatic, but is requesting a cb to confirm if OV is needed.

## 2016-05-31 NOTE — Telephone Encounter (Signed)
Please have her monitor her blood pressure once daily for the next week and notify us in one week if her blood pressure remains at or above 135/90.

## 2016-05-31 NOTE — Assessment & Plan Note (Signed)
Has reduced her Effexor down to 1 capsule (75 mg) daily. Overall doing well on the reduced dose.

## 2016-06-08 ENCOUNTER — Ambulatory Visit (INDEPENDENT_AMBULATORY_CARE_PROVIDER_SITE_OTHER): Payer: BC Managed Care – PPO | Admitting: Psychology

## 2016-06-08 DIAGNOSIS — F411 Generalized anxiety disorder: Secondary | ICD-10-CM

## 2016-06-14 ENCOUNTER — Telehealth: Payer: Self-pay | Admitting: Primary Care

## 2016-06-14 DIAGNOSIS — R109 Unspecified abdominal pain: Secondary | ICD-10-CM | POA: Diagnosis not present

## 2016-06-14 NOTE — Telephone Encounter (Signed)
noted 

## 2016-06-14 NOTE — Telephone Encounter (Signed)
Patient Name: Kayla LevanCHANTELE Santori DOB: Aug 09, 1970 Initial Comment Caller states she has anxiety and depression. She now has a kidney stone that has been lodged on her left side. She is hurting from her back rib around to her abdomen. She has had some dewy brown vaginal discharge that she is concerned about. She is also feeling fatigued. Nurse Assessment Nurse: Charna Elizabethrumbull, RN, Cathy Date/Time (Eastern Time): 06/14/2016 8:25:25 AM Confirm and document reason for call. If symptomatic, describe symptoms. ---Caller states has developed left flank pain again yesterday (pain rated as a 7 on the 1 to 10 scale). She developed some unusual brown vaginal discharge about 2 days ago. No fever. Alert and responsive. Does the patient have any new or worsening symptoms? ---Yes Will a triage be completed? ---Yes Related visit to physician within the last 2 weeks? ---No Does the PT have any chronic conditions? (i.e. diabetes, asthma, etc.) ---Yes List chronic conditions. ---Kidney Stones, High Blood Pressure, Anxiety, Depression Is the patient pregnant or possibly pregnant? (Ask all females between the ages of 7312-55) ---No Is this a behavioral health or substance abuse call? ---No Guidelines Guideline Title Affirmed Question Affirmed Notes Flank Pain [1] SEVERE pain (e.g., excruciating, scale 8-10) AND [2] not improved after pain medicine Final Disposition User Go to ED Now Charna Elizabethrumbull, RN, Cathy [facility] Disagree/Comply: Comply

## 2016-06-14 NOTE — Telephone Encounter (Signed)
Spoken to patient and she is at her appointment with her urologist. She will update us if needed.

## 2016-06-14 NOTE — Telephone Encounter (Signed)
When did she decrease. Her med list says to take 2 tabs of Effexor daily.

## 2016-06-14 NOTE — Telephone Encounter (Signed)
Patient called back after she saw urologist this morning. Patient was told she had some inflammation in her lower back. She was told to take some ibuprofen. Patient called back and wanted to know if she can go back to taking 2 capsule of the venlafaxine. Patient stated this similar to what she had experience before  and believes it is part of the anxiety, she is having currently. So now she just wants to know if she can go back to taking 2 capsule. Please advise.

## 2016-06-14 NOTE — Telephone Encounter (Signed)
Can you call and check on her. If there are appts here, she does not need to go to ER

## 2016-06-15 NOTE — Telephone Encounter (Signed)
Patient stated that for a few months now but believe she should go back to 2 tablets.

## 2016-06-15 NOTE — Telephone Encounter (Signed)
Yes, please increase back up to 2 capsules as I feel this is the best dose for her. Please adjust her medication list.

## 2016-06-15 NOTE — Telephone Encounter (Signed)
Per DPR, left detail message of [name]'s comments for patient to call back. 

## 2016-06-17 NOTE — Telephone Encounter (Signed)
Send the patient a message through MyChart since I did try to call again.

## 2016-06-18 NOTE — Telephone Encounter (Signed)
Patient viewed the message on MyChart.

## 2016-06-22 ENCOUNTER — Ambulatory Visit (INDEPENDENT_AMBULATORY_CARE_PROVIDER_SITE_OTHER): Payer: BC Managed Care – PPO | Admitting: Psychology

## 2016-06-22 DIAGNOSIS — F411 Generalized anxiety disorder: Secondary | ICD-10-CM

## 2016-06-30 ENCOUNTER — Ambulatory Visit (INDEPENDENT_AMBULATORY_CARE_PROVIDER_SITE_OTHER): Payer: BC Managed Care – PPO | Admitting: Family Medicine

## 2016-06-30 ENCOUNTER — Encounter: Payer: Self-pay | Admitting: Family Medicine

## 2016-06-30 ENCOUNTER — Telehealth: Payer: Self-pay | Admitting: Primary Care

## 2016-06-30 DIAGNOSIS — J069 Acute upper respiratory infection, unspecified: Secondary | ICD-10-CM

## 2016-06-30 MED ORDER — FLUCONAZOLE 150 MG PO TABS: 150.0000 mg | ORAL_TABLET | Freq: Once | ORAL | 0 refills | Status: AC

## 2016-06-30 MED ORDER — BENZONATATE 200 MG PO CAPS: 200.0000 mg | ORAL_CAPSULE | Freq: Two times a day (BID) | ORAL | 0 refills | Status: DC | PRN

## 2016-06-30 NOTE — Assessment & Plan Note (Addendum)
Symptoms most consistent with viral upper respiratory infection. I advised her to discontinue the Augmentin. She'll continue her over-the-counter medication. We'll treat with Tessalon for cough as well. She does report vaginal itching that started after starting on the Augmentin and thus we will treat with Diflucan. If it does not improve she'll return for a pelvic exam. She'll monitor her symptoms and if not improving over the next several days she can restart the Augmentin and contact us next week. I did reassure her that the marks on the back of her legs appear to be crease marks from her pants. She's given return precautions.

## 2016-06-30 NOTE — Telephone Encounter (Signed)
Pt called and stated that she needs a return to work note for 07/02/16. Please advise, thank you!  Call pt @ [phone]

## 2016-06-30 NOTE — Telephone Encounter (Signed)
I forgot to provide the patient with this. Please print out a note releasing her back to work on 07/02/16. Thanks.

## 2016-06-30 NOTE — Patient Instructions (Signed)
Nice to see you. You likely have a viral infection. We'll treat this supportively. You may continue the over-the-counter medications. I will discontinue the Augmentin. We will start on Tessalon as needed for cough. If your symptoms do not improve you can restart the Augmentin on Saturday or Sunday.

## 2016-06-30 NOTE — Telephone Encounter (Signed)
Please advise 

## 2016-06-30 NOTE — Telephone Encounter (Signed)
Faxed to patients work.

## 2016-06-30 NOTE — Progress Notes (Signed)
  Marikay AlarEric Kimbra Marcelino, MD Phone: [phone]  [name] is a 46 y.o. female who presents today for same day visit.  Patient notes 4-5 days of nasal and sinus congestion. Notes some sore throat. Cough productive of clear/old mucus. Was blowing some greenish mucus out of her nose. Ears feel full. No fevers though she did have some sweats on Sunday. No shortness of breath. She was taking medication with Tylenol, chlorpheniramine, dextromethorphan, and phenylephrine with some benefit. She did also start on Augmentin which she had at home. She additionally notes both legs were a little swollen previously though this has improved. She noted there were lines on her legs as though she had been whipped though these have improved. She notices these after crossing her legs while her legs were swollen. She notes after starting on the Augmentin she developed some vaginal itching.   PMH: nonsmoker.   ROS see history of present illness  Objective  Physical Exam Vitals:   06/30/16 1019  BP: 134/90  Pulse: (!) 108  Temp: 98.7 F (37.1 C)    BP Readings from Last 3 Encounters:  06/30/16 134/90  05/31/16 136/84  05/05/16 138/82   Wt Readings from Last 3 Encounters:  06/30/16 217 lb 3.2 oz (98.5 kg)  05/31/16 217 lb (98.4 kg)  05/05/16 217 lb 12 oz (98.8 kg)    Physical Exam  Constitutional: No distress.  HENT:  Head: Normocephalic and atraumatic.  Mouth/Throat: Oropharynx is clear and moist. No oropharyngeal exudate.  Eyes: Conjunctivae are normal. Pupils are equal, round, and reactive to light.  Cardiovascular: Normal rate, regular rhythm and normal heart sounds.   Pulmonary/Chest: Effort normal and breath sounds normal.  Musculoskeletal: She exhibits no edema.  Crease marks noted over the skin of her posterior legs from her parents  Neurological: She is alert. Gait normal.  Skin: Skin is warm and dry. She is not diaphoretic.     Assessment/Plan: Please see individual problem  list.  Viral URI Symptoms most consistent with viral upper respiratory infection. I advised her to discontinue the Augmentin. She'll continue her over-the-counter medication. We'll treat with Tessalon for cough as well. She does report vaginal itching that started after starting on the Augmentin and thus we will treat with Diflucan. If it does not improve she'll return for a pelvic exam. She'll monitor her symptoms and if not improving over the next several days she can restart the Augmentin and contact us next week. I did reassure her that the marks on the back of her legs appear to be crease marks from her pants. She's given return precautions.   No orders of the defined types were placed in this encounter.   Meds ordered this encounter  Medications  . fluconazole (DIFLUCAN) 150 MG tablet    Sig: Take 1 tablet (150 mg total) by mouth once.    Dispense:  1 tablet    Refill:  0  . benzonatate (TESSALON) 200 MG capsule    Sig: Take 1 capsule (200 mg total) by mouth 2 (two) times daily as needed for cough.    Dispense:  20 capsule    Refill:  0   Marikay AlarEric Faelyn Sigler, MD [facility] [place]

## 2016-07-05 ENCOUNTER — Telehealth: Payer: Self-pay | Admitting: Primary Care

## 2016-07-05 MED ORDER — AMOXICILLIN-POT CLAVULANATE 875-125 MG PO TABS: 1.0000 | ORAL_TABLET | Freq: Two times a day (BID) | ORAL | 0 refills | Status: DC

## 2016-07-05 NOTE — Telephone Encounter (Signed)
An additional 2 days of Augmentin was sent to the pharmacy. This should complete a 7 day course.

## 2016-07-05 NOTE — Telephone Encounter (Signed)
Please advise 

## 2016-07-05 NOTE — Telephone Encounter (Signed)
Pt called and stated that she restarted the antibiotic on Saturday, pt states that she has feeling really bad and having fevers. Pt will need a refill on this, she states that she is starting to feel better. She would like the generic called in. Pt would like to be called when completed. Please advise, thank you!  Pharmacy - [phone] [place], [place] - [phone]  Call pt @ [phone]

## 2016-07-05 NOTE — Telephone Encounter (Signed)
Patient notified

## 2016-07-15 ENCOUNTER — Ambulatory Visit (INDEPENDENT_AMBULATORY_CARE_PROVIDER_SITE_OTHER): Payer: BC Managed Care – PPO | Admitting: Psychology

## 2016-07-15 DIAGNOSIS — F411 Generalized anxiety disorder: Secondary | ICD-10-CM | POA: Diagnosis not present

## 2016-07-18 ENCOUNTER — Other Ambulatory Visit: Payer: Self-pay | Admitting: Primary Care

## 2016-07-18 DIAGNOSIS — F411 Generalized anxiety disorder: Secondary | ICD-10-CM

## 2016-08-03 ENCOUNTER — Ambulatory Visit (INDEPENDENT_AMBULATORY_CARE_PROVIDER_SITE_OTHER): Payer: BC Managed Care – PPO | Admitting: Psychology

## 2016-08-03 DIAGNOSIS — F411 Generalized anxiety disorder: Secondary | ICD-10-CM

## 2016-08-06 ENCOUNTER — Encounter: Payer: Self-pay | Admitting: Primary Care

## 2016-08-06 ENCOUNTER — Ambulatory Visit (INDEPENDENT_AMBULATORY_CARE_PROVIDER_SITE_OTHER): Payer: BC Managed Care – PPO | Admitting: Primary Care

## 2016-08-06 DIAGNOSIS — F411 Generalized anxiety disorder: Secondary | ICD-10-CM | POA: Diagnosis not present

## 2016-08-06 NOTE — Patient Instructions (Signed)
Start Melatonin 5 mg tablets as needed for sleep. Take 1 hour before bedtime. May increase up to 10 mg if needed.  Continue to work with [employer] on anxiety.  It was a pleasure to see you today!

## 2016-08-06 NOTE — Assessment & Plan Note (Signed)
Suspect insomnia from anxiety, suspect fatigue secondary to insomnia. Will have her try Melatonin 5 mg tablets 1 hour before bedtime in the future PRN. Will have her continue working with her therapist regarding her anxiety. Work noted provided today. She does not appear acutely ill.

## 2016-08-06 NOTE — Progress Notes (Signed)
Subjective:    Patient ID: Kayla Robles, female    DOB: 1970/03/05, 46 y.o.   MRN: [id number]  HPI  Kayla Robles is a 46 year old female with a history of anxiety who presents today with a chief complaint of insomnia.   History of anxiety disorder. Currently managed on Effexor XR 150 mg. She's not sleeping well due to anxiety, nightmares. She's following with her therapist and feels this is going well. She's recently started in a new department at work and this is going well. She's also feeling fatigued since she's not slept hardly at all this week. She's having trouble falling asleep. She will wake several times during the night. Her difficulty sleeping has been present for the past 2 week. She took an OTC sleep aid (ZzzQuil) last night and woke up today feeling dizzy and "out of it".   Prior to bed she will playing on her tablet. She doesn't typically drink caffeine after 12 pm. She's been exercising and has lost some weight. She had to take off today from work due to her lack of sleep during this week. Today she's feeling more rested.  Review of Systems  Constitutional: Positive for fatigue. Negative for fever.  Respiratory: Negative for shortness of breath.   Cardiovascular: Negative for chest pain.  Psychiatric/Behavioral: Positive for sleep disturbance. The patient is nervous/anxious.        Past Medical History:  Diagnosis Date  . Anxiety   . Hypertension   . Kidney stones   . S/P cholecystectomy t  . S/P tubal ligation 1992     Social History   Social History  . Marital status: Married    Spouse name: N/A  . Number of children: N/A  . Years of education: N/A   Occupational History  . Not on file.   Social History Main Topics  . Smoking status: Never Smoker  . Smokeless tobacco: Never Used  . Alcohol use No  . Drug use: No  . Sexual activity: Not on file   Other Topics Concern  . Not on file   Social History Narrative   Married.   Works as a Administrator.   Enjoys playing with her grandchildren.       Past Surgical History:  Procedure Laterality Date  . CHOLECYSTECTOMY    . LITHOTRIPSY      Family History  Problem Relation Age of Onset  . Arthritis Mother   . Heart disease Mother   . Hypertension Mother   . Hypertension Father   . Diabetes Father     Allergies  Allergen Reactions  . Levaquin [Levofloxacin]     "felt weird"  . Other     Most anxiety medications  . Paroxetine Hcl   . Zithromax [Azithromycin]     Current Outpatient Prescriptions on File Prior to Visit  Medication Sig Dispense Refill  . hydrochlorothiazide (MICROZIDE) 12.5 MG capsule TAKE 1 CAPSULE (12.5 MG TOTAL) BY MOUTH DAILY. 90 capsule 2  . venlafaxine XR (EFFEXOR-XR) 75 MG 24 hr capsule TAKE 2 CAPSULES (150 MG TOTAL) BY MOUTH DAILY WITH BREAKFAST. 60 capsule 1   No current facility-administered medications on file prior to visit.     BP 136/84   Pulse 67   Temp 98.3 F (36.8 C) (Oral)   Ht 5\' 2"  (1.575 m)   Wt 214 lb 12.8 oz (97.4 kg)   SpO2 97%   BMI 39.29 kg/m    Objective:   Physical Exam  Constitutional: She appears well-nourished.  Neck: Neck supple.  Cardiovascular: Normal rate.   Pulmonary/Chest: Effort normal.  Skin: Skin is warm and dry.  Psychiatric: She has a normal mood and affect.          Assessment & Plan:

## 2016-08-08 ENCOUNTER — Other Ambulatory Visit: Payer: Self-pay | Admitting: Primary Care

## 2016-08-08 DIAGNOSIS — I10 Essential (primary) hypertension: Secondary | ICD-10-CM

## 2016-08-11 ENCOUNTER — Telehealth: Payer: Self-pay

## 2016-08-11 MED ORDER — HYDROCHLOROTHIAZIDE 25 MG PO TABS: 25.0000 mg | ORAL_TABLET | Freq: Every day | ORAL | 1 refills | Status: DC

## 2016-08-11 NOTE — Telephone Encounter (Signed)
Patient is correct. According to [name]'s progress notes and assessment on 05/31/2016. Jae DireKate had increase the HCTZ to 25 mg. I have re-send the Rx with the new dosage to the pharmacy.  Per DPR, left detail message for patient regarding the info above.

## 2016-08-11 NOTE — Addendum Note (Signed)
Addended by: Tawnya CrookSAMBATH, Lowen Barringer on: 08/11/2016 04:01 PM   Modules accepted: Orders

## 2016-08-11 NOTE — Telephone Encounter (Signed)
Pt left v/m; pt has been taking HCTZ 25 mg daily as instructed since 05/31/16. Pt cannot get refill because [facility] [place] [facility] not [facility] new HCTZ 25 mg rx. Pt request cb. Med list still has HCTZ 12.5 mg taking one daily.Please advise.

## 2016-08-23 ENCOUNTER — Ambulatory Visit (INDEPENDENT_AMBULATORY_CARE_PROVIDER_SITE_OTHER): Payer: BC Managed Care – PPO | Admitting: Psychology

## 2016-08-23 DIAGNOSIS — F411 Generalized anxiety disorder: Secondary | ICD-10-CM | POA: Diagnosis not present

## 2016-09-06 ENCOUNTER — Ambulatory Visit (INDEPENDENT_AMBULATORY_CARE_PROVIDER_SITE_OTHER): Payer: BC Managed Care – PPO | Admitting: Psychology

## 2016-09-06 DIAGNOSIS — F411 Generalized anxiety disorder: Secondary | ICD-10-CM

## 2016-09-12 ENCOUNTER — Other Ambulatory Visit: Payer: Self-pay | Admitting: Primary Care

## 2016-09-12 DIAGNOSIS — F411 Generalized anxiety disorder: Secondary | ICD-10-CM

## 2016-09-20 ENCOUNTER — Ambulatory Visit (INDEPENDENT_AMBULATORY_CARE_PROVIDER_SITE_OTHER): Payer: BC Managed Care – PPO | Admitting: Psychology

## 2016-09-20 DIAGNOSIS — F411 Generalized anxiety disorder: Secondary | ICD-10-CM

## 2016-10-04 ENCOUNTER — Ambulatory Visit (INDEPENDENT_AMBULATORY_CARE_PROVIDER_SITE_OTHER): Payer: BC Managed Care – PPO | Admitting: Psychology

## 2016-10-04 DIAGNOSIS — F411 Generalized anxiety disorder: Secondary | ICD-10-CM

## 2016-10-18 ENCOUNTER — Ambulatory Visit (INDEPENDENT_AMBULATORY_CARE_PROVIDER_SITE_OTHER): Payer: BC Managed Care – PPO | Admitting: Psychology

## 2016-10-18 DIAGNOSIS — F411 Generalized anxiety disorder: Secondary | ICD-10-CM

## 2016-11-02 ENCOUNTER — Ambulatory Visit (INDEPENDENT_AMBULATORY_CARE_PROVIDER_SITE_OTHER): Payer: BC Managed Care – PPO | Admitting: Psychology

## 2016-11-02 DIAGNOSIS — F411 Generalized anxiety disorder: Secondary | ICD-10-CM

## 2016-11-10 ENCOUNTER — Other Ambulatory Visit: Payer: Self-pay | Admitting: Primary Care

## 2016-11-10 DIAGNOSIS — F411 Generalized anxiety disorder: Secondary | ICD-10-CM

## 2016-11-20 ENCOUNTER — Other Ambulatory Visit: Payer: Self-pay | Admitting: Primary Care

## 2016-11-22 ENCOUNTER — Telehealth: Payer: Self-pay | Admitting: Primary Care

## 2016-11-22 NOTE — Telephone Encounter (Signed)
Per DPR, left detail message of [name]'s comments for patient to call back. 

## 2016-11-22 NOTE — Telephone Encounter (Signed)
Copied from CRM [id number]. Topic: Quick Communication - See Telephone Encounter >> Nov 22, 2016  8:04 AM Diana EvesHoyt, Maryann B wrote: CRM for notification. See Telephone encounter for:  11/22/16.   pt would like to come off of venlafaxine. Pt doesn't want to come off if she feels she shouldn't be. But she has recently lost her job and states the reason she was on the medicine is because of her job. shes feeling pretty confident and come off of the medicine

## 2016-11-22 NOTE — Telephone Encounter (Signed)
If she'd like to come off of the medication then that's fine, but she'll have to be slowly weaned off. I recommend we start with lowering the dose to 37.5 mg once daily for several weeks. Let me know if she's agreeable and I'll send in a new prescription for the lower dose.

## 2016-11-23 ENCOUNTER — Encounter: Payer: Self-pay | Admitting: Primary Care

## 2016-11-23 ENCOUNTER — Telehealth: Payer: Self-pay | Admitting: Primary Care

## 2016-11-23 DIAGNOSIS — F411 Generalized anxiety disorder: Secondary | ICD-10-CM

## 2016-11-23 NOTE — Telephone Encounter (Signed)
Copied from CRM [id number]. Topic: Quick Communication - See Telephone Encounter >> Nov 23, 2016  9:33 AM Everardo PacificMoton, Cay Kath, [place] wrote: CRM for notification. See Telephone encounter for:  11/23/16. Patient would like to know how to come off of her venlafaxine. Patient feels like she is ready to come off of this medication. Would like if someone could could give her a call back so she wouldn't have to be seen to come off, but if she is needed to come in she will. Wants to know the side effects of coming of the medication

## 2016-11-24 MED ORDER — VENLAFAXINE HCL ER 37.5 MG PO CP24: 37.5000 mg | ORAL_CAPSULE | Freq: Every day | ORAL | 0 refills | Status: DC

## 2016-11-24 NOTE — Telephone Encounter (Signed)
Spoken and notified patient of Regina's comments. Patient verbalized understanding. 

## 2016-11-24 NOTE — Telephone Encounter (Signed)
Spoken to patient. She stated that she was recently let go from her job. Patient is feeling better since she is not at that stressful job.  Patient does want to wean off the venlafaxine still. She was wondering if she could take 75 mg which is 1 capsule until she runs out. She has two bottle and will her insurance running out, she would like to finish this before pick up a new Rx.

## 2016-11-24 NOTE — Telephone Encounter (Signed)
Ok to take the 75 mg until she runs out, but she can not just stop the medication when she runs out. Will need to wean down to 37.5 mg daily per [name]'s recommendation. She should let us know when she is close to running out.

## 2016-11-24 NOTE — Telephone Encounter (Signed)
See my chart message

## 2016-11-24 NOTE — Telephone Encounter (Signed)
To PCP fyi

## 2016-11-29 ENCOUNTER — Telehealth: Payer: Self-pay

## 2016-11-29 MED ORDER — HYDROCHLOROTHIAZIDE 25 MG PO TABS: 25.0000 mg | ORAL_TABLET | Freq: Every day | ORAL | 0 refills | Status: DC

## 2016-11-29 NOTE — Telephone Encounter (Signed)
Send refill as patient's request to [facility]

## 2016-11-29 NOTE — Telephone Encounter (Signed)
Copied from CRM #[id number]. Topic: Quick Communication - See Telephone Encounter >> Nov 29, 2016  4:29 PM Lilia Proeid, Karysha J wrote: CRM for notification. See Telephone encounter for: hydrochlorothiazide (HYDRODIURIL) 25 MG tablet  [facility] [place] [facility] [address] as Primary pharmacy and 90 day supply if possible  11/29/16.

## 2016-11-29 NOTE — Telephone Encounter (Signed)
Copied from CRM [phone]. Topic: General - Other >> Nov 29, 2016  3:09 PM Cipriano BunkerLambe, Annette S wrote: Reason for CRM: Wants [facility] as Primary pharmacy and 90 day supply if possible.

## 2016-12-22 ENCOUNTER — Encounter: Payer: Self-pay | Admitting: Primary Care

## 2016-12-23 ENCOUNTER — Encounter: Payer: Self-pay | Admitting: Primary Care

## 2017-02-26 ENCOUNTER — Encounter: Payer: Self-pay | Admitting: Primary Care

## 2017-02-26 DIAGNOSIS — F411 Generalized anxiety disorder: Secondary | ICD-10-CM

## 2017-03-01 MED ORDER — VENLAFAXINE HCL ER 75 MG PO CP24: 75.0000 mg | ORAL_CAPSULE | Freq: Every day | ORAL | 1 refills | Status: DC

## 2017-03-21 ENCOUNTER — Encounter: Payer: Self-pay | Admitting: Primary Care

## 2017-03-21 DIAGNOSIS — F411 Generalized anxiety disorder: Secondary | ICD-10-CM

## 2017-03-22 MED ORDER — VENLAFAXINE HCL ER 150 MG PO CP24: 150.0000 mg | ORAL_CAPSULE | Freq: Every day | ORAL | 0 refills | Status: DC

## 2017-04-09 ENCOUNTER — Other Ambulatory Visit: Payer: Self-pay | Admitting: Primary Care

## 2017-04-19 ENCOUNTER — Emergency Department (HOSPITAL_COMMUNITY): Payer: BLUE CROSS/BLUE SHIELD

## 2017-04-19 ENCOUNTER — Ambulatory Visit: Payer: Self-pay | Admitting: *Deleted

## 2017-04-19 ENCOUNTER — Other Ambulatory Visit: Payer: Self-pay

## 2017-04-19 ENCOUNTER — Encounter (HOSPITAL_COMMUNITY): Payer: Self-pay

## 2017-04-19 ENCOUNTER — Emergency Department (HOSPITAL_COMMUNITY)
Admission: EM | Admit: 2017-04-19 | Discharge: 2017-04-19 | Disposition: A | Discharge status: Home / Self Care | Payer: BLUE CROSS/BLUE SHIELD | Attending: Emergency Medicine | Admitting: Emergency Medicine

## 2017-04-19 DIAGNOSIS — I10 Essential (primary) hypertension: Secondary | ICD-10-CM | POA: Insufficient documentation

## 2017-04-19 DIAGNOSIS — Z79899 Other long term (current) drug therapy: Secondary | ICD-10-CM | POA: Insufficient documentation

## 2017-04-19 DIAGNOSIS — R0789 Other chest pain: Secondary | ICD-10-CM | POA: Diagnosis not present

## 2017-04-19 DIAGNOSIS — R079 Chest pain, unspecified: Secondary | ICD-10-CM | POA: Diagnosis not present

## 2017-04-19 LAB — URINALYSIS, ROUTINE W REFLEX MICROSCOPIC
Bilirubin Urine: NEGATIVE
Glucose, UA: NEGATIVE mg/dL
Ketones, ur: NEGATIVE mg/dL
Leukocytes, UA: NEGATIVE
Nitrite: NEGATIVE
Protein, ur: NEGATIVE mg/dL
Specific Gravity, Urine: 1.012 (ref 1.005–1.030)
pH: 7 (ref 5.0–8.0)

## 2017-04-19 LAB — I-STAT TROPONIN, ED
Troponin i, poc: 0 ng/mL (ref 0.00–0.08)
Troponin i, poc: 0 ng/mL (ref 0.00–0.08)

## 2017-04-19 LAB — CBG MONITORING, ED: Glucose-Capillary: 124 mg/dL — ABNORMAL HIGH (ref 65–99)

## 2017-04-19 LAB — CBC
HCT: 41.5 % (ref 36.0–46.0)
Hemoglobin: 14.7 g/dL (ref 12.0–15.0)
MCH: 31.7 pg (ref 26.0–34.0)
MCHC: 35.4 g/dL (ref 30.0–36.0)
MCV: 89.6 fL (ref 78.0–100.0)
Platelets: 302 10*3/uL (ref 150–400)
RBC: 4.63 MIL/uL (ref 3.87–5.11)
RDW: 12.8 % (ref 11.5–15.5)
WBC: 8 10*3/uL (ref 4.0–10.5)

## 2017-04-19 LAB — BASIC METABOLIC PANEL
Anion gap: 12 (ref 5–15)
BUN: 5 mg/dL — ABNORMAL LOW (ref 6–20)
CO2: 25 mmol/L (ref 22–32)
Calcium: 9.4 mg/dL (ref 8.9–10.3)
Chloride: 102 mmol/L (ref 101–111)
Creatinine, Ser: 0.65 mg/dL (ref 0.44–1.00)
GFR calc Af Amer: >60 mL/min (ref >60)
GFR calc non Af Amer: >60 mL/min (ref >60)
Glucose, Bld: 108 mg/dL — ABNORMAL HIGH (ref 65–99)
Potassium: 3.2 mmol/L — ABNORMAL LOW (ref 3.5–5.1)
Sodium: 139 mmol/L (ref 135–145)

## 2017-04-19 NOTE — Telephone Encounter (Signed)
Patient currently in the ED now. Please have patient schedule an appointment with me post discharge.

## 2017-04-19 NOTE — ED Notes (Signed)
Pt ambulatory to restroom with steady gait. Given specimen cup for UA

## 2017-04-19 NOTE — ED Notes (Signed)
Patient transported to X-ray 

## 2017-04-19 NOTE — ED Notes (Signed)
Pt visibly anxious at this time. This RN explained plan of care to patient and family. Awaiting EDP at this time. Lights dimmer for comfort

## 2017-04-19 NOTE — Telephone Encounter (Signed)
Pt called with complaints of high blood; last pm it was 190/100; she states that she was upset when she got that reading when she calmed down it was 169/98; this am it was 140/106 later it was 152/100; she also reports having swelling in her hands and feet, nausea, and "not feeling good'; the pt states that these symptoms are also in line with her anxiety; nurse triage initiated and recommendations made per protocol to include going to the ED now; pt verbalizes understanding and states that [facility]; will route to office for notification of this encounter; this pt is normally seen by Mayra ReelKate Clark, [facility]; also the pt can be contacted at [phone].  Reason for Disposition . [1] Systolic BP  >= 160 OR Diastolic >= 100 AND [2] cardiac or neurologic symptoms (e.g., chest pain, difficulty breathing, unsteady gait, blurred vision)  Answer Assessment - Initial Assessment Questions 1. BLOOD PRESSURE: "What is the blood pressure?" "Did you take at least two measurements 5 minutes apart?"     140/106 at 0730 this am; 152/100 0830 this morning 2. ONSET: "When did you take your blood pressure?"     This am at 0730 and 0830 3. HOW: "How did you obtain the blood pressure?" (e.g., visiting nurse, automatic home BP monitor)   Automatic cuff on left arm for 1st value;  right wrist cuff for 2nd value 4. HISTORY: "Do you have a history of high blood pressure?"     yes 5. MEDICATIONS: "Are you taking any medications for blood pressure?" "Have you missed any doses recently?"     no 6. OTHER SYMPTOMS: "Do you have any symptoms?" (e.g., headache, chest pain, blurred vision, difficulty breathing, weakness)     Headache, chest pain; pressure in chest; tingling in hands states it "all goes in line with her anxiety" 7. PREGNANCY: "Is there any chance you are pregnant?" "When was your last menstrual period?"     No LMP 04/08/17  Protocols used: HIGH BLOOD PRESSURE-A-AH

## 2017-04-19 NOTE — ED Notes (Signed)
ED Provider at bedside. 

## 2017-04-19 NOTE — ED Provider Notes (Signed)
[facility] EMERGENCY DEPARTMENT Provider Note   CSN: [id number] Arrival date & time: 04/19/17  1212     History   Chief Complaint Chief Complaint  Patient presents with  . Hypertension  . Chest Pain    HPI Kayla Robles is a 47 y.o. female with past medical history of anxiety, hypertension, presenting to the ED for anxiety and waking this morning feeling "bad."  She states she has had increased stress level with new job and planning her daughter's baby shower this weekend.  She states she has felt more anxiety the past few days, with history of anxiety.  States she woke up this morning feeling worse, with associated tingling sensation in the left arm, mild nausea.  She called her PCPs office, who recommended she report to the ED for further evaluation.  In triage, patient reports she had a syncopal episode immediately after they drew blood.  She states she felt her vision blackening out, nausea and she felt anxious with the multiple people in her room and speaking at the same time prior to the syncopal episode.  Currently patient states she feels calm and asymptomatic, denies CP, SOB, tingling, nausea, diaphoresis. No hx DVT/PE. No recent period of immobility, no trauma or surgery, no exogenous estrogen use.  The history is provided by the patient.    Past Medical History:  Diagnosis Date  . Anxiety   . Hypertension   . Kidney stones   . S/P cholecystectomy t  . S/P tubal ligation 1992    Patient Active Problem List   Diagnosis Date Noted  . Viral URI 06/30/2016  . Preventative health care 09/08/2015  . Prediabetes 09/08/2015  . Generalized anxiety disorder 03/24/2015  . Essential hypertension 03/24/2015    Past Surgical History:  Procedure Laterality Date  . CHOLECYSTECTOMY    . LITHOTRIPSY       OB History   None      Home Medications    Prior to Admission medications   Medication Sig Start Date End Date Taking? Authorizing Provider    acetaminophen (TYLENOL) 325 MG tablet Take 650 mg by mouth every 6 (six) hours as needed for mild pain.   Yes [provider]  amoxicillin (AMOXIL) 500 MG capsule Take 500 mg by mouth once.   Yes [provider]  hydrochlorothiazide (HYDRODIURIL) 25 MG tablet TAKE 1 TABLET BY MOUTH ONCE DAILY Patient taking differently: 25mg  by mouth once daily 04/11/17  Yes Doreene Nest, NP  venlafaxine XR (EFFEXOR-XR) 150 MG 24 hr capsule Take 1 capsule (150 mg total) by mouth daily with breakfast. 03/22/17  Yes Doreene Nest, NP    Family History Family History  Problem Relation Age of Onset  . Arthritis Mother   . Heart disease Mother   . Hypertension Mother   . Hypertension Father   . Diabetes Father     Social History Social History   Tobacco Use  . Smoking status: Never Smoker  . Smokeless tobacco: Never Used  Substance Use Topics  . Alcohol use: No  . Drug use: No     Allergies   Levaquin [levofloxacin]; Zithromax [azithromycin]; Other; and Paroxetine hcl   Review of Systems Review of Systems  Constitutional: Negative for chills and fever.  Respiratory: Negative for shortness of breath.   Cardiovascular: Positive for chest pain (resolved).  Gastrointestinal: Positive for nausea. Negative for abdominal pain.  Neurological: Positive for syncope and light-headedness.  All other systems reviewed and are negative.  Physical Exam Updated Vital Signs BP 126/78   Pulse 81   Temp 98.2 F (36.8 C) (Oral)   Resp 13   SpO2 93%   Physical Exam  Constitutional: She is oriented to person, place, and time. She appears well-developed and well-nourished. No distress.  Pt Is well-appearing, not in distress.  HENT:  Head: Normocephalic and atraumatic.  Eyes: Pupils are equal, round, and reactive to light. Conjunctivae and EOM are normal.  Neck: Normal range of motion. Neck supple. No JVD present. No tracheal deviation present.  Cardiovascular: Normal  rate, regular rhythm, normal heart sounds and intact distal pulses.  Pulmonary/Chest: Effort normal and breath sounds normal. No stridor. No respiratory distress. She has no wheezes. She has no rales.  Abdominal: Soft. Bowel sounds are normal. She exhibits no distension. There is no tenderness. There is no rebound and no guarding.  Musculoskeletal:  No lower extremity edema or tenderness.  Lymphadenopathy:    She has no cervical adenopathy.  Neurological: She is alert and oriented to person, place, and time.  Mental Status:  Alert, oriented, thought content appropriate, able to give a coherent history. Speech fluent without evidence of aphasia. Able to follow 2 step commands without difficulty.  Cranial Nerves:  II:  Peripheral visual fields grossly normal, pupils equal, round, reactive to light III,IV, VI: ptosis not present, extra-ocular motions intact bilaterally  V,VII: smile symmetric, facial light touch sensation equal VIII: hearing grossly normal to voice  X: uvula elevates symmetrically  XI: bilateral shoulder shrug symmetric and strong XII: midline tongue extension without fassiculations Motor:  Normal tone. 5/5 in upper and lower extremities bilaterally including strong and equal grip strength and dorsiflexion/plantar flexion Sensory: Pinprick and light touch normal in all extremities.  Deep Tendon Reflexes: 2+ and symmetric in the biceps and patella Cerebellar: normal finger-to-nose with bilateral upper extremities Gait: normal gait and balance CV: distal pulses palpable throughout    Skin: Skin is warm.  Psychiatric: She has a normal mood and affect. Her behavior is normal.  Nursing note and vitals reviewed.    ED Treatments / Results  Labs (all labs ordered are listed, but only abnormal results are displayed) Labs Reviewed  BASIC METABOLIC PANEL - Abnormal; Notable for the following components:      Result Value   Potassium 3.2 (*)    Glucose, Bld 108 (*)    BUN 5  (*)    All other components within normal limits  URINALYSIS, ROUTINE W REFLEX MICROSCOPIC - Abnormal; Notable for the following components:   APPearance HAZY (*)    Hgb urine dipstick LARGE (*)    Bacteria, UA RARE (*)    Squamous Epithelial / LPF 0-5 (*)    All other components within normal limits  CBG MONITORING, ED - Abnormal; Notable for the following components:   Glucose-Capillary 124 (*)    All other components within normal limits  CBC  I-STAT TROPONIN, ED  I-STAT TROPONIN, ED    EKG EKG Interpretation  Date/Time:  Tuesday April 19 2017 12:31:48 EDT Ventricular Rate:  100 PR Interval:  132 QRS Duration: 78 QT Interval:  388 QTC Calculation: 500 R Axis:   -23 Text Interpretation:  Normal sinus rhythm Cannot rule out Anterior infarct , age undetermined Prolonged QT Abnormal ECG similar to previous Confirmed by Frederick Peers [id number]) on 04/19/2017 3:58:26 PM   Radiology Dg Chest 2 View  Result Date: 04/19/2017 CLINICAL DATA:  Hypertension with sweating and headache EXAM: CHEST - 2 VIEW COMPARISON:  May 24, 2014 FINDINGS: Lungs are clear. Heart size and pulmonary vascularity are normal. No adenopathy. No bone lesions. IMPRESSION: No edema or consolidation. Electronically Signed   By: Bretta BangWilliam  Woodruff III M.D.   On: 04/19/2017 14:04    Procedures Procedures (including critical care time)  Medications Ordered in ED Medications - No data to display   Initial Impression / Assessment and Plan / ED Course  I have reviewed the triage vital signs and the nursing notes.  Pertinent labs & imaging results that were available during my care of the patient were reviewed by me and considered in my medical decision making (see chart for details).     Patient presenting with symptoms consistent with anxiety.  Reporting heightened level of stress at home these past few days, and woke up this morning feeling more anxious with elevation in BP. Known hx of anxiety, treated with  effexor. She states she felt tingling sensation in her arm, with some shortness of breath, chest tightness. In triage, patient had a syncopal episode with prodrome of feeling anxious, lightheaded, and nauseous.  On my evaluation, patient is asymptomatic, stating she feels much more calm and relaxed.  Normal heart and lung exam.  VSS. EKG acute changes.  Troponin negative.  CBC and BMP unremarkable.  UA negative for infection.  Low suspicion for cardiac or pulmonary etiology of symptoms.  Low risk factors for cardiac disease. No risk factors for PE.  On reevaluation, patient remains asymptomatic.  Stating when she begins thinking about her stress at home, she begins feeling anxiety and feeling similar symptoms.  Plan for repeat troponin.  If negative, patient is safe for discharge with PCP follow-up. Handoff to Sharen Hecklaudia Gibbons, PA-C at shift change pending repeat troponin.  Patient discussed with Dr. Rosalia Hammersay, who agrees with care plan.  Final Clinical Impressions(s) / ED Diagnoses   Final diagnoses:  None    ED Discharge Orders    None       Inis Borneman, [place] N, PA-C 04/19/17 1607    Margarita Grizzleay, Danielle, MD 04/19/17 1700

## 2017-04-19 NOTE — Discharge Instructions (Addendum)
Labs, EKG, chest x-ray, heart enzymes are normal today. Please follow up with cardiology for further evaluation of chest pain.  Return for chest pain with exertion or activity, shortness of breath, palpitations, fevers, cough, dizziness or passing out.

## 2017-04-19 NOTE — ED Provider Notes (Signed)
Pt handed off to me by previous EDPA at shift change pending delta trop at 1600. Please see previous EDPA note for full HPI, PE, ROS. Briefly pt here for atypical chest pain. PE not thought to be high on differential. No tachycardia, tachypnea, hypoxia.   1640: Delta trop negative. VS WNL. Patient deemed safe for discharge at this time with cardiology f/u. Return precautions given at discharge.    Liberty HandyGibbons, Tamelia Michalowski J, PA-C 04/19/17 1642    Margarita Grizzleay, Danielle, MD 04/19/17 [phone]

## 2017-04-19 NOTE — ED Notes (Signed)
Pt verbalized understanding of all d/c instructions and f/u information. VSS. All belongings with pt at this time. Pt ambulatory to lobby with steady gait, pt declined wheelchair

## 2017-04-19 NOTE — ED Notes (Addendum)
Pt CBG was 124, notified Kaleigh(RN)

## 2017-04-19 NOTE — ED Triage Notes (Signed)
Pt states she has had hypertension today. She states increased fatigue. Pt states she has had tingling in her left arm that began today, cold sweats. Pt had a brief period of unresponsiveness and dry heaving in triage. Pt now alert and oriented.

## 2017-04-20 ENCOUNTER — Ambulatory Visit: Payer: Self-pay | Admitting: Family Medicine

## 2017-04-20 NOTE — Telephone Encounter (Signed)
LM for patient (okay per DPR) on cell number to call and schedule ER follow up as soon as possible.

## 2017-04-20 NOTE — Telephone Encounter (Signed)
Spoke with patient.  She could not come this Friday and had me reschedule her hospital follow up for next Thursday at 7:15am.  She is under a lot of stress and pressure and due to work issues is having difficulty keeping appointments.  She will call first of next week if unable to work out her appointment next week with work.

## 2017-04-20 NOTE — Telephone Encounter (Signed)
Noted, emergency department chart reviewed.

## 2017-04-22 ENCOUNTER — Ambulatory Visit: Payer: Self-pay | Admitting: Primary Care

## 2017-04-28 ENCOUNTER — Ambulatory Visit: Payer: BLUE CROSS/BLUE SHIELD | Admitting: Primary Care

## 2017-04-28 ENCOUNTER — Encounter: Payer: Self-pay | Admitting: Primary Care

## 2017-04-28 VITALS — BP 136/82 | HR 72 | Temp 98.2°F | Ht 62.0 in | Wt 220.4 lb

## 2017-04-28 DIAGNOSIS — F411 Generalized anxiety disorder: Secondary | ICD-10-CM | POA: Diagnosis not present

## 2017-04-28 DIAGNOSIS — I10 Essential (primary) hypertension: Secondary | ICD-10-CM

## 2017-04-28 MED ORDER — LISINOPRIL 10 MG PO TABS: ORAL_TABLET | ORAL | 0 refills | Status: DC

## 2017-04-28 MED ORDER — HYDROXYZINE HCL 25 MG PO TABS: ORAL_TABLET | ORAL | 0 refills | Status: DC

## 2017-04-28 NOTE — Progress Notes (Signed)
Subjective:    Patient ID: Kayla Robles, female    DOB: December 05, 1970, 47 y.o.   MRN: [id number]  HPI  Ms. Kayla Robles is a 47 year old female with a history of hypertension and anxiety who presents today for emergency department follow up.  She phoned into the triage system on 04/19/17 with complaints of high blood pressure readings, anxiety, extremity swelling to hands and feet, nausea, etc. Give such high blood pressure readings she was directed to go to Urgent Care.   She presented to [facility] who sent her to the emergency department. [facility] with same complaints. During her triage in the ED she had a "syncopal" episode as she felt anxious with the number of staff in her room. She endorsed increased stress with personal and work life. She also endorsed a "tingling" sensation to her upper extremity with chest discomfort and shortness of breath.  During her stay in the ED she underwent ECG without acute changes. Her Troponin levels were negative. Other labs unremarkable including UA, CBC, CMP. Given grossly normal work up she was released home later that day.   Since her ED visit she's feeling better. She has recently switched jobs and was under some stress last week which caused increased anxiety. Overall she enjoys her job and is feeling less stressed now. She did send a message via My Chart in late February with requests to increase her Venlafaxine due to her new job. She's not seen her therapist in months as she left the practice. She plans on reading a book called "mindfulness".   BP Readings from Last 3 Encounters:  04/28/17 136/82  04/19/17 (!) 142/84  08/06/16 136/84   She is checking her BP at home which is running high 130's/80's. She denies chest pain, dizziness, shortness of breath. She is having difficulty falling asleep and will wake several times during the night without noticing it. Her husband reports that she will wake up  screaming and shaking. She does snore at night. Her husband denies periods of apnea and she doesn't feel drowsy during the night. She was once on alprazolam for this with improvement. Difficulty sleeping has been problematic for years.   Review of Systems  Respiratory: Negative for shortness of breath.   Cardiovascular: Negative for chest pain.  Neurological: Negative for dizziness and headaches.  Psychiatric/Behavioral: The patient is nervous/anxious.        See HPI       Past Medical History:  Diagnosis Date  . Anxiety   . Hypertension   . Kidney stones   . S/P cholecystectomy t  . S/P tubal ligation 1992     Social History   Socioeconomic History  . Marital status: Married    Spouse name: Not on file  . Number of children: Not on file  . Years of education: Not on file  . Highest education level: Not on file  Occupational History  . Not on file  Social Needs  . Financial resource strain: Not on file  . Food insecurity:    Worry: Not on file    Inability: Not on file  . Transportation needs:    Medical: Not on file    Non-medical: Not on file  Tobacco Use  . Smoking status: Never Smoker  . Smokeless tobacco: Never Used  Substance and Sexual Activity  . Alcohol use: No  . Drug use: No  . Sexual activity: Not on file  Lifestyle  .  Physical activity:    Days per week: Not on file    Minutes per session: Not on file  . Stress: Not on file  Relationships  . Social connections:    Talks on phone: Not on file    Gets together: Not on file    Attends religious service: Not on file    Active member of club or organization: Not on file    Attends meetings of clubs or organizations: Not on file    Relationship status: Not on file  . Intimate partner violence:    Fear of current or ex partner: Not on file    Emotionally abused: Not on file    Physically abused: Not on file    Forced sexual activity: Not on file  Other Topics Concern  . Not on file  Social  History Narrative   Married.   Works as a Air traffic controller.   Enjoys playing with her grandchildren.    Past Surgical History:  Procedure Laterality Date  . CHOLECYSTECTOMY    . LITHOTRIPSY      Family History  Problem Relation Age of Onset  . Arthritis Mother   . Heart disease Mother   . Hypertension Mother   . Hypertension Father   . Diabetes Father     Allergies  Allergen Reactions  . Levaquin [Levofloxacin] Anaphylaxis    "felt weird" felt worse than usually felt  . Zithromax [Azithromycin] Anaphylaxis  . Other Other (See Comments)    Most anxiety medications  . Paroxetine Hcl Other (See Comments)    suicidal    Current Outpatient Medications on File Prior to Visit  Medication Sig Dispense Refill  . acetaminophen (TYLENOL) 325 MG tablet Take 650 mg by mouth every 6 (six) hours as needed for mild pain.    . hydrochlorothiazide (HYDRODIURIL) 25 MG tablet TAKE 1 TABLET BY MOUTH ONCE DAILY 90 tablet 0  . venlafaxine XR (EFFEXOR-XR) 150 MG 24 hr capsule Take 1 capsule (150 mg total) by mouth daily with breakfast. 90 capsule 0   No current facility-administered medications on file prior to visit.     BP 136/82   Pulse 72   Temp 98.2 F (36.8 C) (Oral)   Ht 5\' 2"  (1.575 m)   Wt 220 lb 6.4 oz (100 kg)   SpO2 98%   BMI 40.31 kg/m    Objective:   Physical Exam  Constitutional: She appears well-nourished.  Neck: Neck supple.  Cardiovascular: Normal rate and regular rhythm.  Pulmonary/Chest: Effort normal and breath sounds normal.  Skin: Skin is warm and dry.  Psychiatric: She has a normal mood and affect.          Assessment & Plan:  All hospital notes and labs reviewed.  Doreene Nest, NP

## 2017-04-28 NOTE — Assessment & Plan Note (Signed)
Unstable at times, feels better this week.  Do agree to continue increased dose of Effexor at 150 mg.  Do suspect insomnia is secondary to anxiety. Will refrain from using Benzos. Rx for low dose hydroxyzine sent to pharmacy to use HS PRN, drowsiness precautions provided.  Strongly suggested she start seeing a therapist again for which she kindly declines as she does not have the time.   Will continue to monitor symptoms. Mood stable today.

## 2017-04-28 NOTE — Patient Instructions (Signed)
Start lisinopril 10 mg tablets for high blood pressure. Continue hydrochlorothiazide 25 mg tablets for high blood pressure.  You may take the hydroxyzine 25 mg tablets at bedtime as needed for anxiety and sleep.   We will see you for your upcoming physical.  It was a pleasure to see you today!

## 2017-04-28 NOTE — Assessment & Plan Note (Signed)
Has been borderline on numerous office visits.  Given recent increases that were likely secondary to anxiety, will add in low dose lisinopril 10 mg to regimen. Continue HCTZ 25 mg.   We will see her next week for CPE. BMP at that visit.

## 2017-05-05 ENCOUNTER — Encounter: Payer: Self-pay | Admitting: Primary Care

## 2017-05-05 ENCOUNTER — Ambulatory Visit (INDEPENDENT_AMBULATORY_CARE_PROVIDER_SITE_OTHER): Payer: BLUE CROSS/BLUE SHIELD | Admitting: Primary Care

## 2017-05-05 VITALS — BP 132/80 | HR 97 | Temp 98.2°F | Ht 62.0 in | Wt 217.5 lb

## 2017-05-05 DIAGNOSIS — I10 Essential (primary) hypertension: Secondary | ICD-10-CM

## 2017-05-05 DIAGNOSIS — F411 Generalized anxiety disorder: Secondary | ICD-10-CM

## 2017-05-05 DIAGNOSIS — Z0000 Encounter for general adult medical examination without abnormal findings: Secondary | ICD-10-CM

## 2017-05-05 DIAGNOSIS — Z1231 Encounter for screening mammogram for malignant neoplasm of breast: Secondary | ICD-10-CM

## 2017-05-05 DIAGNOSIS — R7303 Prediabetes: Secondary | ICD-10-CM | POA: Diagnosis not present

## 2017-05-05 DIAGNOSIS — Z23 Encounter for immunization: Secondary | ICD-10-CM

## 2017-05-05 DIAGNOSIS — Z1239 Encounter for other screening for malignant neoplasm of breast: Secondary | ICD-10-CM

## 2017-05-05 NOTE — Assessment & Plan Note (Signed)
Due for repeat A1C today, pending.  Discussed the importance of a healthy diet and regular exercise in order for weight loss, and to reduce the risk of any potential medical problems.

## 2017-05-05 NOTE — Assessment & Plan Note (Signed)
Tdap due, provided today.  Mammogram due, orders placed. Pap smear UTD. Discussed the importance of a healthy diet and regular exercise in order for weight loss, and to reduce the risk of any potential medical problems. Exam unremarkable. Labs pending. Follow up in 1 year.

## 2017-05-05 NOTE — Assessment & Plan Note (Signed)
Stable in the office today, continue Lisinopril 10 mg and HCTZ 25 mg. BMP pending today.

## 2017-05-05 NOTE — Patient Instructions (Signed)
Stop by the lab prior to leaving today. I will notify you of your results once received.   Work on [employer]. Limit biscuits, sugary cereals, sweets, milk. Increase vegetables, fruit, whole grains, lean protein.  Ensure you are consuming 64 ounces of water daily.  Start exercising. You should be getting 150 minutes of moderate intensity exercise weekly.  Call the [facility] to schedule your mammogram.  You were provided with a tetanus shot (Tdap) which will cover you for 10 years.  Follow up in 1 year for your annual exam or sooner if needed.  It was a pleasure to see you today!   Preventive Care 40-64 Years, Female Preventive care refers to lifestyle choices and visits with your health care provider that can promote health and wellness. What does preventive care include?  A yearly physical exam. This is also called an annual well check.  Dental exams once or twice a year.  Routine eye exams. Ask your health care provider how often you should have your eyes checked.  Personal lifestyle choices, including: ? Daily care of your teeth and gums. ? Regular physical activity. ? Eating a healthy diet. ? Avoiding tobacco and drug use. ? Limiting alcohol use. ? Practicing safe sex. ? Taking low-dose aspirin daily starting at age 62. ? Taking vitamin and mineral supplements as recommended by your health care provider. What happens during an annual well check? The services and screenings done by your health care provider during your annual well check will depend on your age, overall health, lifestyle risk factors, and family history of disease. Counseling Your health care provider may ask you questions about your:  Alcohol use.  Tobacco use.  Drug use.  Emotional well-being.  Home and relationship well-being.  Sexual activity.  Eating habits.  Work and work Statistician.  Method of birth control.  Menstrual cycle.  Pregnancy history.  Screening You may  have the following tests or measurements:  Height, weight, and BMI.  Blood pressure.  Lipid and cholesterol levels. These may be checked every 5 years, or more frequently if you are over 42 years old.  Skin check.  Lung cancer screening. You may have this screening every year starting at age 51 if you have a 30-pack-year history of smoking and currently smoke or have quit within the past 15 years.  Fecal occult blood test (FOBT) of the stool. You may have this test every year starting at age 106.  Flexible sigmoidoscopy or colonoscopy. You may have a sigmoidoscopy every 5 years or a colonoscopy every 10 years starting at age 61.  Hepatitis C blood test.  Hepatitis B blood test.  Sexually transmitted disease (STD) testing.  Diabetes screening. This is done by checking your blood sugar (glucose) after you have not eaten for a while (fasting). You may have this done every 1-3 years.  Mammogram. This may be done every 1-2 years. Talk to your health care provider about when you should start having regular mammograms. This may depend on whether you have a family history of breast cancer.  BRCA-related cancer screening. This may be done if you have a family history of breast, ovarian, tubal, or peritoneal cancers.  Pelvic exam and Pap test. This may be done every 3 years starting at age 60. Starting at age 83, this may be done every 5 years if you have a Pap test in combination with an HPV test.  Bone density scan. This is done to screen for osteoporosis. You may have  this scan if you are at high risk for osteoporosis.  Discuss your test results, treatment options, and if necessary, the need for more tests with your health care provider. Vaccines Your health care provider may recommend certain vaccines, such as:  Influenza vaccine. This is recommended every year.  Tetanus, diphtheria, and acellular pertussis (Tdap, Td) vaccine. You may need a Td booster every 10 years.  Varicella  vaccine. You may need this if you have not been vaccinated.  Zoster vaccine. You may need this after age 11.  Measles, mumps, and rubella (MMR) vaccine. You may need at least one dose of MMR if you were born in 1957 or later. You may also need a second dose.  Pneumococcal 13-valent conjugate (PCV13) vaccine. You may need this if you have certain conditions and were not previously vaccinated.  Pneumococcal polysaccharide (PPSV23) vaccine. You may need one or two doses if you smoke cigarettes or if you have certain conditions.  Meningococcal vaccine. You may need this if you have certain conditions.  Hepatitis A vaccine. You may need this if you have certain conditions or if you travel or work in places where you may be exposed to hepatitis A.  Hepatitis B vaccine. You may need this if you have certain conditions or if you travel or work in places where you may be exposed to hepatitis B.  Haemophilus influenzae type b (Hib) vaccine. You may need this if you have certain conditions.  Talk to your health care provider about which screenings and vaccines you need and how often you need them. This information is not intended to replace advice given to you by your health care provider. Make sure you discuss any questions you have with your health care provider. Document Released: 02/07/2015 Document Revised: 10/01/2015 Document Reviewed: 11/12/2014 Elsevier Interactive Patient Education  [employer].

## 2017-05-05 NOTE — Addendum Note (Signed)
Addended by: Tawnya CrookSAMBATH, Jahkari Maclin on: 05/05/2017 12:38 PM   Modules accepted: Orders

## 2017-05-05 NOTE — Addendum Note (Signed)
Addended by: Alvina ChouWALSH, TERRI J on: 05/05/2017 09:30 AM   Modules accepted: Orders

## 2017-05-05 NOTE — Assessment & Plan Note (Signed)
Doing better on increased dose of Effexor 150 mg. Continue same. Denies SI/HI.

## 2017-05-05 NOTE — Progress Notes (Signed)
Subjective:    Patient ID: Kayla Robles, female    DOB: 01/14/1971, 47 y.o.   MRN: [id number]  HPI  Kayla Robles is a 47 year old female who presents today for complete physical.  Immunizations: -Tetanus: Completed over 10 years ago. -Influenza: Did not complete last season.   Diet: She endorses a fair diet. Breakfast: Cereal, bacon/egg/cheese biscuit, oatmeal Lunch: Soup, salad, sandwich Dinner: Meat, vegetable, fruit Snacks: Occasionally  Desserts: Daily  Beverages: Buttermilk, water, little coffee  Exercise: She is not exercising Eye exam: Completed in 2019 Dental exam: Completes semi-annually  Pap Smear: Completed in 2017 Mammogram: Due.   Review of Systems  Constitutional: Negative for unexpected weight change.  HENT: Negative for rhinorrhea.   Respiratory: Negative for cough and shortness of breath.   Cardiovascular: Negative for chest pain.  Gastrointestinal: Negative for constipation and diarrhea.  Genitourinary: Negative for difficulty urinating and menstrual problem.  Musculoskeletal: Negative for arthralgias and myalgias.  Skin: Negative for rash.  Allergic/Immunologic: Negative for environmental allergies.  Neurological: Negative for dizziness, numbness and headaches.  Psychiatric/Behavioral:       Feels anxiety has reduced       Past Medical History:  Diagnosis Date  . Anxiety   . Hypertension   . Kidney stones   . S/P cholecystectomy t  . S/P tubal ligation 1992     Social History   Socioeconomic History  . Marital status: Married    Spouse name: Not on file  . Number of children: Not on file  . Years of education: Not on file  . Highest education level: Not on file  Occupational History  . Not on file  Social Needs  . Financial resource strain: Not on file  . Food insecurity:    Worry: Not on file    Inability: Not on file  . Transportation needs:    Medical: Not on file    Non-medical: Not on file  Tobacco Use  . Smoking  status: Never Smoker  . Smokeless tobacco: Never Used  Substance and Sexual Activity  . Alcohol use: No  . Drug use: No  . Sexual activity: Not on file  Lifestyle  . Physical activity:    Days per week: Not on file    Minutes per session: Not on file  . Stress: Not on file  Relationships  . Social connections:    Talks on phone: Not on file    Gets together: Not on file    Attends religious service: Not on file    Active member of club or organization: Not on file    Attends meetings of clubs or organizations: Not on file    Relationship status: Not on file  . Intimate partner violence:    Fear of current or ex partner: Not on file    Emotionally abused: Not on file    Physically abused: Not on file    Forced sexual activity: Not on file  Other Topics Concern  . Not on file  Social History Narrative   Married.   Works as a Air traffic controllerdeputy clerk.   Enjoys playing with her grandchildren.    Past Surgical History:  Procedure Laterality Date  . CHOLECYSTECTOMY    . LITHOTRIPSY      Family History  Problem Relation Age of Onset  . Arthritis Mother   . Heart disease Mother   . Hypertension Mother   . Hypertension Father   . Diabetes Father     Allergies  Allergen Reactions  . Levaquin [Levofloxacin] Anaphylaxis    "felt weird" felt worse than usually felt  . Zithromax [Azithromycin] Anaphylaxis  . Other Other (See Comments)    Most anxiety medications  . Paroxetine Hcl Other (See Comments)    suicidal    Current Outpatient Medications on File Prior to Visit  Medication Sig Dispense Refill  . acetaminophen (TYLENOL) 325 MG tablet Take 650 mg by mouth every 6 (six) hours as needed for mild pain.    . hydrochlorothiazide (HYDRODIURIL) 25 MG tablet TAKE 1 TABLET BY MOUTH ONCE DAILY 90 tablet 0  . hydrOXYzine (ATARAX/VISTARIL) 25 MG tablet Take 1 tablet by mouth once nightly for anxiety/sleep. 30 tablet 0  . lisinopril (PRINIVIL,ZESTRIL) 10 MG tablet Take 1 tablet by mouth  once daily for high blood pressure. 30 tablet 0  . venlafaxine XR (EFFEXOR-XR) 150 MG 24 hr capsule Take 1 capsule (150 mg total) by mouth daily with breakfast. 90 capsule 0   No current facility-administered medications on file prior to visit.     BP 132/80   Pulse 97   Temp 98.2 F (36.8 C) (Oral)   Ht 5\' 2"  (1.575 m)   Wt 217 lb 8 oz (98.7 kg)   SpO2 97%   BMI 39.78 kg/m    Objective:   Physical Exam  Constitutional: She is oriented to person, place, and time. She appears well-nourished.  HENT:  Right Ear: Tympanic membrane and ear canal normal.  Left Ear: Tympanic membrane and ear canal normal.  Nose: Nose normal.  Mouth/Throat: Oropharynx is clear and moist.  Eyes: Pupils are equal, round, and reactive to light. Conjunctivae and EOM are normal.  Neck: Neck supple. No thyromegaly present.  Cardiovascular: Normal rate and regular rhythm.  No murmur heard. Pulmonary/Chest: Effort normal and breath sounds normal. She has no rales.  Abdominal: Soft. Bowel sounds are normal. There is no tenderness.  Musculoskeletal: Normal range of motion.  Lymphadenopathy:    She has no cervical adenopathy.  Neurological: She is alert and oriented to person, place, and time. She has normal reflexes. No cranial nerve deficit.  Skin: Skin is warm and dry. No rash noted.  Psychiatric: She has a normal mood and affect.          Assessment & Plan:

## 2017-05-06 ENCOUNTER — Telehealth: Payer: Self-pay | Admitting: Primary Care

## 2017-05-06 DIAGNOSIS — Z1239 Encounter for other screening for malignant neoplasm of breast: Secondary | ICD-10-CM

## 2017-05-06 LAB — COMPREHENSIVE METABOLIC PANEL
ALT: 83 IU/L — ABNORMAL HIGH (ref 0–32)
AST: 85 IU/L — ABNORMAL HIGH (ref 0–40)
Albumin/Globulin Ratio: 1.7 (ref 1.2–2.2)
Albumin: 4.9 g/dL (ref 3.5–5.5)
Alkaline Phosphatase: 79 IU/L (ref 39–117)
BUN/Creatinine Ratio: 13 (ref 9–23)
BUN: 9 mg/dL (ref 6–24)
Bilirubin Total: 0.3 mg/dL (ref 0.0–1.2)
CO2: 25 mmol/L (ref 20–29)
Calcium: 10 mg/dL (ref 8.7–10.2)
Chloride: 97 mmol/L (ref 96–106)
Creatinine, Ser: 0.69 mg/dL (ref 0.57–1.00)
GFR calc Af Amer: 119 mL/min/{1.73_m2} (ref >59)
GFR calc non Af Amer: 103 mL/min/{1.73_m2} (ref >59)
Globulin, Total: 2.9 g/dL (ref 1.5–4.5)
Glucose: 140 mg/dL — ABNORMAL HIGH (ref 65–99)
Potassium: 4.2 mmol/L (ref 3.5–5.2)
Sodium: 141 mmol/L (ref 134–144)
Total Protein: 7.8 g/dL (ref 6.0–8.5)

## 2017-05-06 LAB — HEMOGLOBIN A1C
Est. average glucose Bld gHb Est-mCnc: 143 mg/dL
Hgb A1c MFr Bld: 6.6 % — ABNORMAL HIGH (ref 4.8–5.6)

## 2017-05-06 LAB — LIPID PANEL
Chol/HDL Ratio: 7.6 ratio — ABNORMAL HIGH (ref 0.0–4.4)
Cholesterol, Total: 235 mg/dL — ABNORMAL HIGH (ref 100–199)
HDL: 31 mg/dL — ABNORMAL LOW (ref >39)
LDL Calculated: 137 mg/dL — ABNORMAL HIGH (ref 0–99)
Triglycerides: 335 mg/dL — ABNORMAL HIGH (ref 0–149)
VLDL Cholesterol Cal: 67 mg/dL — ABNORMAL HIGH (ref 5–40)

## 2017-05-06 NOTE — Telephone Encounter (Signed)
Orders changed, entered in error.

## 2017-05-06 NOTE — Telephone Encounter (Signed)
Do you want pt to have diagnostic mammogram or screening? Order is for diagnostic

## 2017-05-09 ENCOUNTER — Encounter: Payer: Self-pay | Admitting: Primary Care

## 2017-05-09 DIAGNOSIS — E119 Type 2 diabetes mellitus without complications: Secondary | ICD-10-CM

## 2017-05-09 DIAGNOSIS — I10 Essential (primary) hypertension: Secondary | ICD-10-CM

## 2017-05-09 DIAGNOSIS — E785 Hyperlipidemia, unspecified: Secondary | ICD-10-CM

## 2017-05-09 MED ORDER — ATORVASTATIN CALCIUM 10 MG PO TABS: ORAL_TABLET | ORAL | 1 refills | Status: DC

## 2017-05-09 MED ORDER — METFORMIN HCL 500 MG PO TABS: ORAL_TABLET | ORAL | 1 refills | Status: DC

## 2017-05-18 ENCOUNTER — Other Ambulatory Visit: Payer: Self-pay | Admitting: Primary Care

## 2017-05-18 DIAGNOSIS — F411 Generalized anxiety disorder: Secondary | ICD-10-CM

## 2017-05-18 DIAGNOSIS — I10 Essential (primary) hypertension: Secondary | ICD-10-CM

## 2017-06-23 ENCOUNTER — Other Ambulatory Visit: Payer: Self-pay | Admitting: Primary Care

## 2017-06-23 DIAGNOSIS — F411 Generalized anxiety disorder: Secondary | ICD-10-CM

## 2017-06-24 MED ORDER — VENLAFAXINE HCL ER 150 MG PO CP24: 150.0000 mg | ORAL_CAPSULE | Freq: Every day | ORAL | 1 refills | Status: DC

## 2017-06-24 NOTE — Telephone Encounter (Signed)
Refill sent to pharmacy.   

## 2017-06-24 NOTE — Telephone Encounter (Signed)
Ok to refill? Electronically refill request for venlafaxine XR (EFFEXOR-XR) 150 MG 24 hr capsule  Last prescribed on 03/22/2017. Last seen on 05/05/2017

## 2017-08-04 ENCOUNTER — Other Ambulatory Visit: Payer: Self-pay | Admitting: Primary Care

## 2017-08-04 DIAGNOSIS — F411 Generalized anxiety disorder: Secondary | ICD-10-CM

## 2017-08-05 MED ORDER — HYDROCHLOROTHIAZIDE 25 MG PO TABS: 25.0000 mg | ORAL_TABLET | Freq: Every day | ORAL | 1 refills | Status: DC

## 2017-08-05 MED ORDER — HYDROXYZINE HCL 25 MG PO TABS: ORAL_TABLET | ORAL | 0 refills | Status: DC

## 2017-10-09 DIAGNOSIS — I10 Essential (primary) hypertension: Secondary | ICD-10-CM

## 2017-10-09 DIAGNOSIS — E119 Type 2 diabetes mellitus without complications: Secondary | ICD-10-CM

## 2017-10-09 DIAGNOSIS — F411 Generalized anxiety disorder: Secondary | ICD-10-CM

## 2017-10-11 MED ORDER — HYDROCHLOROTHIAZIDE 25 MG PO TABS: 25.0000 mg | ORAL_TABLET | Freq: Every day | ORAL | 1 refills | Status: DC

## 2017-10-11 MED ORDER — LISINOPRIL 10 MG PO TABS: 10.0000 mg | ORAL_TABLET | Freq: Every day | ORAL | 1 refills | Status: DC

## 2017-10-11 MED ORDER — VENLAFAXINE HCL ER 150 MG PO CP24: 150.0000 mg | ORAL_CAPSULE | Freq: Every day | ORAL | 1 refills | Status: DC

## 2017-10-11 MED ORDER — METFORMIN HCL 500 MG PO TABS: ORAL_TABLET | ORAL | 1 refills | Status: DC

## 2018-03-08 ENCOUNTER — Encounter: Payer: Self-pay | Admitting: Primary Care

## 2018-03-08 ENCOUNTER — Other Ambulatory Visit (HOSPITAL_COMMUNITY)
Admission: RE | Admit: 2018-03-08 | Discharge: 2018-03-08 | Disposition: A | Discharge status: Home / Self Care | Payer: BLUE CROSS/BLUE SHIELD | Source: Ambulatory Visit | Attending: Primary Care | Admitting: Primary Care

## 2018-03-08 ENCOUNTER — Ambulatory Visit (INDEPENDENT_AMBULATORY_CARE_PROVIDER_SITE_OTHER): Payer: BLUE CROSS/BLUE SHIELD | Admitting: Primary Care

## 2018-03-08 VITALS — BP 130/86 | HR 75 | Temp 98.5°F | Ht 62.0 in | Wt 201.5 lb

## 2018-03-08 DIAGNOSIS — E119 Type 2 diabetes mellitus without complications: Secondary | ICD-10-CM | POA: Diagnosis not present

## 2018-03-08 DIAGNOSIS — Z124 Encounter for screening for malignant neoplasm of cervix: Secondary | ICD-10-CM | POA: Insufficient documentation

## 2018-03-08 DIAGNOSIS — I10 Essential (primary) hypertension: Secondary | ICD-10-CM

## 2018-03-08 DIAGNOSIS — Z1239 Encounter for other screening for malignant neoplasm of breast: Secondary | ICD-10-CM | POA: Diagnosis not present

## 2018-03-08 DIAGNOSIS — F411 Generalized anxiety disorder: Secondary | ICD-10-CM | POA: Diagnosis not present

## 2018-03-08 DIAGNOSIS — Z0000 Encounter for general adult medical examination without abnormal findings: Secondary | ICD-10-CM | POA: Diagnosis not present

## 2018-03-08 DIAGNOSIS — E1140 Type 2 diabetes mellitus with diabetic neuropathy, unspecified: Secondary | ICD-10-CM

## 2018-03-08 LAB — COMPREHENSIVE METABOLIC PANEL
ALT: 24 U/L (ref 0–35)
AST: 20 U/L (ref 0–37)
Albumin: 4.6 g/dL (ref 3.5–5.2)
Alkaline Phosphatase: 63 U/L (ref 39–117)
BUN: 10 mg/dL (ref 6–23)
CO2: 29 mEq/L (ref 19–32)
Calcium: 9.6 mg/dL (ref 8.4–10.5)
Chloride: 102 mEq/L (ref 96–112)
Creatinine, Ser: 0.69 mg/dL (ref 0.40–1.20)
GFR: 90.27 mL/min (ref >60.00)
Glucose, Bld: 92 mg/dL (ref 70–99)
Potassium: 3.5 mEq/L (ref 3.5–5.1)
Sodium: 140 mEq/L (ref 135–145)
Total Bilirubin: 0.5 mg/dL (ref 0.2–1.2)
Total Protein: 8 g/dL (ref 6.0–8.3)

## 2018-03-08 LAB — LIPID PANEL
Cholesterol: 221 mg/dL — ABNORMAL HIGH (ref 0–200)
HDL: 33.6 mg/dL — ABNORMAL LOW (ref >39.00)
NonHDL: 187.36
Total CHOL/HDL Ratio: 7
Triglycerides: 338 mg/dL — ABNORMAL HIGH (ref 0.0–149.0)
VLDL: 67.6 mg/dL — ABNORMAL HIGH (ref 0.0–40.0)

## 2018-03-08 LAB — HEMOGLOBIN A1C: Hgb A1c MFr Bld: 6.1 % (ref 4.6–6.5)

## 2018-03-08 LAB — LDL CHOLESTEROL, DIRECT: Direct LDL: 132 mg/dL

## 2018-03-08 MED ORDER — GABAPENTIN 100 MG PO CAPS: 100.0000 mg | ORAL_CAPSULE | Freq: Every day | ORAL | 0 refills | Status: DC

## 2018-03-08 NOTE — Assessment & Plan Note (Signed)
Immunizations UTD. Declines pneumonia vaccination. Pap smear due, completed today. Mammogram overdue, orders placed.  Commended her on weight loss efforts, encouraged to work on diet and exercise. Exam unremarkable. Labs pending. Follow up in 1 year for CPE.

## 2018-03-08 NOTE — Assessment & Plan Note (Signed)
Stable in the office today, continue current regimen. 

## 2018-03-08 NOTE — Assessment & Plan Note (Addendum)
Taking Metformin 500 mg once daily. Also with symptoms of neuropathy. Rx for gabapentin sent to pharmacy for HS use, drowsiness precautions provided.  Recheck A1C today. Foot exam today. Declines pneumonia vaccination. Managed on ACE and statin.  Follow up in 6 months for diabetes check

## 2018-03-08 NOTE — Patient Instructions (Addendum)
Stop by the lab prior to leaving today. I will notify you of your results once received.   You can take the gabapentin 100 mg capsules for diabetic neuropathy. You can take 1 or 2 capsules at bedtime. Please update me in a few weeks.  Start exercising. You should be getting 150 minutes of moderate intensity exercise weekly.  It's important to improve your diet by reducing consumption of fast food, fried food, processed snack foods, sugary drinks. Increase consumption of fresh vegetables and fruits, whole grains, water.  Ensure you are drinking 64 ounces of water daily.  Call the [facility] to schedule your mammogram.  Please schedule a follow up appointment in 6 months for diabetes check.   It was a pleasure to see you today!   Preventive Care 40-64 Years, Female Preventive care refers to lifestyle choices and visits with your health care provider that can promote health and wellness. What does preventive care include?   A yearly physical exam. This is also called an annual well check.  Dental exams once or twice a year.  Routine eye exams. Ask your health care provider how often you should have your eyes checked.  Personal lifestyle choices, including: ? Daily care of your teeth and gums. ? Regular physical activity. ? Eating a healthy diet. ? Avoiding tobacco and drug use. ? Limiting alcohol use. ? Practicing safe sex. ? Taking low-dose aspirin daily starting at age 59. ? Taking vitamin and mineral supplements as recommended by your health care provider. What happens during an annual well check? The services and screenings done by your health care provider during your annual well check will depend on your age, overall health, lifestyle risk factors, and family history of disease. Counseling Your health care provider may ask you questions about your:  Alcohol use.  Tobacco use.  Drug use.  Emotional well-being.  Home and relationship  well-being.  Sexual activity.  Eating habits.  Work and work Statistician.  Method of birth control.  Menstrual cycle.  Pregnancy history. Screening You may have the following tests or measurements:  Height, weight, and BMI.  Blood pressure.  Lipid and cholesterol levels. These may be checked every 5 years, or more frequently if you are over 49 years old.  Skin check.  Lung cancer screening. You may have this screening every year starting at age 22 if you have a 30-pack-year history of smoking and currently smoke or have quit within the past 15 years.  Colorectal cancer screening. All adults should have this screening starting at age 14 and continuing until age 79. Your health care provider may recommend screening at age 56. You will have tests every 1-10 years, depending on your results and the type of screening test. People at increased risk should start screening at an earlier age. Screening tests may include: ? Guaiac-based fecal occult blood testing. ? Fecal immunochemical test (FIT). ? Stool DNA test. ? Virtual colonoscopy. ? Sigmoidoscopy. During this test, a flexible tube with a tiny camera (sigmoidoscope) is used to examine your rectum and lower colon. The sigmoidoscope is inserted through your anus into your rectum and lower colon. ? Colonoscopy. During this test, a long, thin, flexible tube with a tiny camera (colonoscope) is used to examine your entire colon and rectum.  Hepatitis C blood test.  Hepatitis B blood test.  Sexually transmitted disease (STD) testing.  Diabetes screening. This is done by checking your blood sugar (glucose) after you have not eaten for a while (fasting).  You may have this done every 1-3 years.  Mammogram. This may be done every 1-2 years. Talk to your health care provider about when you should start having regular mammograms. This may depend on whether you have a family history of breast cancer.  BRCA-related cancer screening. This  may be done if you have a family history of breast, ovarian, tubal, or peritoneal cancers.  Pelvic exam and Pap test. This may be done every 3 years starting at age 46. Starting at age 15, this may be done every 5 years if you have a Pap test in combination with an HPV test.  Bone density scan. This is done to screen for osteoporosis. You may have this scan if you are at high risk for osteoporosis. Discuss your test results, treatment options, and if necessary, the need for more tests with your health care provider. Vaccines Your health care provider may recommend certain vaccines, such as:  Influenza vaccine. This is recommended every year.  Tetanus, diphtheria, and acellular pertussis (Tdap, Td) vaccine. You may need a Td booster every 10 years.  Varicella vaccine. You may need this if you have not been vaccinated.  Zoster vaccine. You may need this after age 85.  Measles, mumps, and rubella (MMR) vaccine. You may need at least one dose of MMR if you were born in 1957 or later. You may also need a second dose.  Pneumococcal 13-valent conjugate (PCV13) vaccine. You may need this if you have certain conditions and were not previously vaccinated.  Pneumococcal polysaccharide (PPSV23) vaccine. You may need one or two doses if you smoke cigarettes or if you have certain conditions.  Meningococcal vaccine. You may need this if you have certain conditions.  Hepatitis A vaccine. You may need this if you have certain conditions or if you travel or work in places where you may be exposed to hepatitis A.  Hepatitis B vaccine. You may need this if you have certain conditions or if you travel or work in places where you may be exposed to hepatitis B.  Haemophilus influenzae type b (Hib) vaccine. You may need this if you have certain conditions. Talk to your health care provider about which screenings and vaccines you need and how often you need them. This information is not intended to replace  advice given to you by your health care provider. Make sure you discuss any questions you have with your health care provider. Document Released: 02/07/2015 Document Revised: 03/03/2017 Document Reviewed: 11/12/2014 Elsevier Interactive Patient Education  2019 [employer].

## 2018-03-08 NOTE — Assessment & Plan Note (Signed)
Much improved since changing positions at work. Doing well on Effexor XR 150 mg, using hydroxyzine PRN. Continue current regimen.

## 2018-03-08 NOTE — Progress Notes (Signed)
Subjective:    Patient ID: Kayla Robles, female    DOB: 1970-07-22, 48 y.o.   MRN: [id number]  HPI  Kayla Robles is a 48 year old female who presents today for complete physical. She also reports neuropathy to her bilateral plantar feet, mostly occurring at night. Intermittent for 6+ months, has gradually increased. She is more active at work, walks 76734 steps daily. Denies symptoms during the day.   Immunizations: -Tetanus: Completed in 2019 -Influenza: Completed this season    Diet: She endorses a fair diet Breakfast: Fast food  Lunch: Soup, fruit, occasional sandwich  Dinner: Vegetables, meat Snacks: None Desserts: 1-2 times weekly  Beverages: Buttermilk, water, flavored water, occasional soda  Exercise: She is active at work, walking 15,000 steps daily. Plans on exercising at the gym. Eye exam: Completed in 2019 Dental exam: Completes semi-annually  Pap Smear: Completed in August 2017, due today Mammogram: No recent mammogram, did not complete last year.   BP Readings from Last 3 Encounters:  03/08/18 130/86  05/05/17 132/80  04/28/17 136/82   Wt Readings from Last 3 Encounters:  03/08/18 201 lb 8 oz (91.4 kg)  05/05/17 217 lb 8 oz (98.7 kg)  04/28/17 220 lb 6.4 oz (100 kg)      Review of Systems  Constitutional: Negative for unexpected weight change.  HENT: Negative for rhinorrhea.   Respiratory: Negative for cough and shortness of breath.   Cardiovascular: Negative for chest pain.  Gastrointestinal: Negative for constipation and diarrhea.  Genitourinary: Negative for difficulty urinating.       LMP in January 2020, lasted one day. Overall less frequently and will skip months  Musculoskeletal: Negative for arthralgias and myalgias.  Skin: Negative for rash.  Allergic/Immunologic: Negative for environmental allergies.  Neurological: Positive for numbness. Negative for headaches.       Pain with numbness/tingling to plantar feet, intermittently     Psychiatric/Behavioral:       Doing well on venlafaxine ER 150 mg       Past Medical History:  Diagnosis Date  . Anxiety   . Hypertension   . Kidney stones   . S/P cholecystectomy t  . S/P tubal ligation 1992     Social History   Socioeconomic History  . Marital status: Married    Spouse name: Not on file  . Number of children: Not on file  . Years of education: Not on file  . Highest education level: Not on file  Occupational History  . Not on file  Social Needs  . Financial resource strain: Not on file  . Food insecurity:    Worry: Not on file    Inability: Not on file  . Transportation needs:    Medical: Not on file    Non-medical: Not on file  Tobacco Use  . Smoking status: Never Smoker  . Smokeless tobacco: Never Used  Substance and Sexual Activity  . Alcohol use: No  . Drug use: No  . Sexual activity: Not on file  Lifestyle  . Physical activity:    Days per week: Not on file    Minutes per session: Not on file  . Stress: Not on file  Relationships  . Social connections:    Talks on phone: Not on file    Gets together: Not on file    Attends religious service: Not on file    Active member of club or organization: Not on file    Attends meetings of clubs or organizations: Not  on file    Relationship status: Not on file  . Intimate partner violence:    Fear of current or ex partner: Not on file    Emotionally abused: Not on file    Physically abused: Not on file    Forced sexual activity: Not on file  Other Topics Concern  . Not on file  Social History Narrative   Married.   Works as a Air traffic controllerdeputy clerk.   Enjoys playing with her grandchildren.    Past Surgical History:  Procedure Laterality Date  . CHOLECYSTECTOMY    . LITHOTRIPSY      Family History  Problem Relation Age of Onset  . Arthritis Mother   . Heart disease Mother   . Hypertension Mother   . Hypertension Father   . Diabetes Father     Allergies  Allergen Reactions  .  Levaquin [Levofloxacin] Anaphylaxis    "felt weird" felt worse than usually felt  . Zithromax [Azithromycin] Anaphylaxis  . Other Other (See Comments)    Most anxiety medications  . Paroxetine Hcl Other (See Comments)    suicidal    Current Outpatient Medications on File Prior to Visit  Medication Sig Dispense Refill  . acetaminophen (TYLENOL) 325 MG tablet Take 650 mg by mouth every 6 (six) hours as needed for mild pain.    . hydrochlorothiazide (HYDRODIURIL) 25 MG tablet Take 1 tablet (25 mg total) by mouth daily. 90 tablet 1  . hydrOXYzine (ATARAX/VISTARIL) 25 MG tablet TAKE 1 TABLET BY MOUTH ONCE NIGHTLY FOR ANXIETY/ SLEEP 30 tablet 0  . lisinopril (PRINIVIL,ZESTRIL) 10 MG tablet Take 1 tablet (10 mg total) by mouth daily. for high blood pressure 90 tablet 1  . metFORMIN (GLUCOPHAGE) 500 MG tablet Take 1 tablet by mouth twice daily with a meal for diabetes. 180 tablet 1  . venlafaxine XR (EFFEXOR-XR) 150 MG 24 hr capsule Take 1 capsule (150 mg total) by mouth daily with breakfast. 90 capsule 1  . atorvastatin (LIPITOR) 10 MG tablet Take 1 tablet by mouth every evening for cholesterol. (Patient not taking: Reported on 03/08/2018) 90 tablet 1   No current facility-administered medications on file prior to visit.     BP 130/86   Pulse 75   Temp 98.5 F (36.9 C) (Oral)   Ht 5\' 2"  (1.575 m)   Wt 201 lb 8 oz (91.4 kg)   SpO2 98%   BMI 36.85 kg/m    Objective:   Physical Exam  Constitutional: She is oriented to person, place, and time. She appears well-nourished.  HENT:  Mouth/Throat: No oropharyngeal exudate.  Eyes: Pupils are equal, round, and reactive to light. EOM are normal.  Neck: Neck supple. No thyromegaly present.  Cardiovascular: Normal rate and regular rhythm.  Respiratory: Effort normal and breath sounds normal.  GI: Soft. Bowel sounds are normal. There is no abdominal tenderness.  Genitourinary: There is no tenderness or lesion on the right labia. There is no  tenderness or lesion on the left labia. Cervix exhibits discharge. Cervix exhibits no motion tenderness. Right adnexum displays no tenderness. Left adnexum displays no tenderness.    No vaginal discharge or erythema.  No erythema in the vagina.    Genitourinary Comments: Scant amount of brownish discharge. No foul smell.   Musculoskeletal: Normal range of motion.  Neurological: She is alert and oriented to person, place, and time.  Skin: Skin is warm and dry.  Psychiatric: She has a normal mood and affect.  Assessment & Plan:

## 2018-03-10 ENCOUNTER — Other Ambulatory Visit: Payer: Self-pay | Admitting: Primary Care

## 2018-03-10 DIAGNOSIS — I10 Essential (primary) hypertension: Secondary | ICD-10-CM

## 2018-03-10 DIAGNOSIS — E119 Type 2 diabetes mellitus without complications: Secondary | ICD-10-CM

## 2018-03-10 DIAGNOSIS — F411 Generalized anxiety disorder: Secondary | ICD-10-CM

## 2018-03-10 LAB — CYTOLOGY - PAP
Diagnosis: NEGATIVE
HPV: NOT DETECTED

## 2018-03-15 ENCOUNTER — Other Ambulatory Visit: Payer: Self-pay | Admitting: Primary Care

## 2018-03-15 NOTE — Telephone Encounter (Signed)
Received faxed refill request for Gabapentin 100 mg to be sent to [facility]. Send patient a message through MyChart regarding this request.

## 2018-03-24 DIAGNOSIS — E1140 Type 2 diabetes mellitus with diabetic neuropathy, unspecified: Secondary | ICD-10-CM

## 2018-03-27 MED ORDER — GABAPENTIN 100 MG PO CAPS: 100.0000 mg | ORAL_CAPSULE | Freq: Every day | ORAL | 0 refills | Status: DC

## 2018-06-08 ENCOUNTER — Other Ambulatory Visit: Payer: Self-pay | Admitting: Primary Care

## 2018-06-08 DIAGNOSIS — E1140 Type 2 diabetes mellitus with diabetic neuropathy, unspecified: Secondary | ICD-10-CM

## 2018-08-10 ENCOUNTER — Other Ambulatory Visit: Payer: Self-pay | Admitting: Primary Care

## 2018-08-10 DIAGNOSIS — E119 Type 2 diabetes mellitus without complications: Secondary | ICD-10-CM

## 2018-08-10 DIAGNOSIS — F411 Generalized anxiety disorder: Secondary | ICD-10-CM

## 2018-08-10 DIAGNOSIS — I10 Essential (primary) hypertension: Secondary | ICD-10-CM

## 2018-09-07 ENCOUNTER — Other Ambulatory Visit: Payer: Self-pay | Admitting: Primary Care

## 2018-09-07 DIAGNOSIS — E1140 Type 2 diabetes mellitus with diabetic neuropathy, unspecified: Secondary | ICD-10-CM

## 2018-09-11 ENCOUNTER — Ambulatory Visit (INDEPENDENT_AMBULATORY_CARE_PROVIDER_SITE_OTHER): Payer: BC Managed Care – PPO | Admitting: Primary Care

## 2018-09-11 ENCOUNTER — Other Ambulatory Visit: Payer: Self-pay

## 2018-09-11 ENCOUNTER — Encounter: Payer: Self-pay | Admitting: Primary Care

## 2018-09-11 VITALS — BP 128/84 | HR 88 | Temp 98.2°F | Ht 62.0 in | Wt 197.0 lb

## 2018-09-11 DIAGNOSIS — F411 Generalized anxiety disorder: Secondary | ICD-10-CM

## 2018-09-11 MED ORDER — VENLAFAXINE HCL ER 37.5 MG PO CP24: 37.5000 mg | ORAL_CAPSULE | Freq: Every day | ORAL | 0 refills | Status: DC

## 2018-09-11 NOTE — Progress Notes (Signed)
Subjective:    Patient ID: Kayla Robles, female    DOB: 1970-07-06, 48 y.o.   MRN: [id number]  HPI  Kayla Robles is a 48 year old female with a history of generalized anxiety disorder, type 2 diabetes, hypertension who presents today with a chief complaint of anxiety.  She is currently managed on venlafaxine ER 150 mg daily, hydroxyzine 25 mg as needed. Symptoms include feeling anxious, body aches, feeling exhausted, difficulty sleeping, feeling tense to her body, irritability, GI upset. Symptoms began about 8 weeks ago on a more mild level, over the last two weeks symptoms began to increase.   She feels as though her venlafaxine has helped with anxiety overall, but has had increased symptoms over the last 8 weeks. She's been working third shift at her occupation since early June 2020 and for the most part she's done well. Is slightly worried about continuing her night shift role as she's having a tough time going from night shifts to weekends/time off. She works 12 straight days, then gets 2 days off.   Review of Systems  Respiratory: Negative for shortness of breath.   Cardiovascular: Negative for chest pain.  Psychiatric/Behavioral: Positive for sleep disturbance. The patient is nervous/anxious.        Past Medical History:  Diagnosis Date  . Anxiety   . Hypertension   . Kidney stones   . S/P cholecystectomy t  . S/P tubal ligation 1992     Social History   Socioeconomic History  . Marital status: Married    Spouse name: Not on file  . Number of children: Not on file  . Years of education: Not on file  . Highest education level: Not on file  Occupational History  . Not on file  Social Needs  . Financial resource strain: Not on file  . Food insecurity    Worry: Not on file    Inability: Not on file  . Transportation needs    Medical: Not on file    Non-medical: Not on file  Tobacco Use  . Smoking status: Never Smoker  . Smokeless tobacco: Never Used   Substance and Sexual Activity  . Alcohol use: No  . Drug use: No  . Sexual activity: Not on file  Lifestyle  . Physical activity    Days per week: Not on file    Minutes per session: Not on file  . Stress: Not on file  Relationships  . Social Musicianconnections    Talks on phone: Not on file    Gets together: Not on file    Attends religious service: Not on file    Active member of club or organization: Not on file    Attends meetings of clubs or organizations: Not on file    Relationship status: Not on file  . Intimate partner violence    Fear of current or ex partner: Not on file    Emotionally abused: Not on file    Physically abused: Not on file    Forced sexual activity: Not on file  Other Topics Concern  . Not on file  Social History Narrative   Married.   Works as a Air traffic controllerdeputy clerk.   Enjoys playing with her grandchildren.    Past Surgical History:  Procedure Laterality Date  . CHOLECYSTECTOMY    . LITHOTRIPSY      Family History  Problem Relation Age of Onset  . Arthritis Mother   . Heart disease Mother   . Hypertension Mother   .  Hypertension Father   . Diabetes Father     Allergies  Allergen Reactions  . Levaquin [Levofloxacin] Anaphylaxis    "felt weird" felt worse than usually felt  . Zithromax [Azithromycin] Anaphylaxis  . Other Other (See Comments)    Most anxiety medications  . Paroxetine Hcl Other (See Comments)    suicidal    Current Outpatient Medications on File Prior to Visit  Medication Sig Dispense Refill  . acetaminophen (TYLENOL) 325 MG tablet Take 650 mg by mouth every 6 (six) hours as needed for mild pain.    Marland Kitchen gabapentin (NEURONTIN) 100 MG capsule TAKE 1 TO 2 CAPSULES AT BEDTIME FOR NEUROPATHY 180 capsule 0  . hydrochlorothiazide (HYDRODIURIL) 25 MG tablet TAKE 1 TABLET DAILY 90 tablet 1  . hydrOXYzine (ATARAX/VISTARIL) 25 MG tablet TAKE 1 TABLET BY MOUTH ONCE NIGHTLY FOR ANXIETY/ SLEEP 30 tablet 0  . lisinopril (ZESTRIL) 10 MG tablet  TAKE 1 TABLET DAILY FOR HIGH BLOOD PRESSURE 90 tablet 1  . metFORMIN (GLUCOPHAGE) 500 MG tablet TAKE 1 TABLET TWICE A DAY WITH MEALS FOR DIABETES 180 tablet 1  . venlafaxine XR (EFFEXOR-XR) 150 MG 24 hr capsule TAKE 1 CAPSULE DAILY WITH BREAKFAST 90 capsule 1  . atorvastatin (LIPITOR) 10 MG tablet Take 1 tablet by mouth every evening for cholesterol. (Patient not taking: Reported on 09/11/2018) 90 tablet 1   No current facility-administered medications on file prior to visit.     BP 128/84   Pulse 88   Temp 98.2 F (36.8 C) (Oral)   Ht 5\' 2"  (1.575 m)   Wt 197 lb (89.4 kg)   SpO2 98%   BMI 36.03 kg/m    Objective:   Physical Exam  Constitutional: She appears well-nourished.  Neck: Neck supple.  Cardiovascular: Normal rate and regular rhythm.  Respiratory: Effort normal and breath sounds normal.  Skin: Skin is warm and dry.  Psychiatric: She has a normal mood and affect.           Assessment & Plan:

## 2018-09-11 NOTE — Patient Instructions (Signed)
Continue taking venlafaxine Er 150 mg.  Start taking venlafaxine Er 37.5 mg along with the 150 mg dose.  Please update me in 2-3 weeks as discussed.  It was a pleasure to see you today!

## 2018-09-11 NOTE — Assessment & Plan Note (Signed)
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 Offered therapy for which she declines due to time. Discussed other options and we decided to increase her venlafaxine by adding 37.5 mg capsule to her regimen.   She will update in 2-3 weeks. Denies SI/HI.

## 2018-10-04 ENCOUNTER — Other Ambulatory Visit: Payer: Self-pay | Admitting: Primary Care

## 2018-10-04 DIAGNOSIS — F411 Generalized anxiety disorder: Secondary | ICD-10-CM

## 2018-12-04 ENCOUNTER — Telehealth: Payer: Self-pay | Admitting: Primary Care

## 2018-12-04 DIAGNOSIS — E1140 Type 2 diabetes mellitus with diabetic neuropathy, unspecified: Secondary | ICD-10-CM

## 2018-12-05 NOTE — Telephone Encounter (Signed)
[facility] filled on 09/08/2018 #180 with 0 refill. LOV 09/11/2018 for anxiety. No future appointments made.

## 2018-12-05 NOTE — Telephone Encounter (Signed)
Patient overdue for diabetes follow up, please schedule.

## 2018-12-06 NOTE — Telephone Encounter (Signed)
Pt is scheduled to see you tomorrow for a follow up. She is also requesting a refill on her Hydroxyzine to be sent to [facility].

## 2018-12-07 ENCOUNTER — Other Ambulatory Visit: Payer: Self-pay

## 2018-12-07 ENCOUNTER — Ambulatory Visit (INDEPENDENT_AMBULATORY_CARE_PROVIDER_SITE_OTHER): Payer: BC Managed Care – PPO | Admitting: Primary Care

## 2018-12-07 VITALS — BP 130/82 | HR 83 | Temp 97.5°F | Ht 62.0 in | Wt 192.1 lb

## 2018-12-07 DIAGNOSIS — I10 Essential (primary) hypertension: Secondary | ICD-10-CM

## 2018-12-07 DIAGNOSIS — M79673 Pain in unspecified foot: Secondary | ICD-10-CM

## 2018-12-07 DIAGNOSIS — F411 Generalized anxiety disorder: Secondary | ICD-10-CM | POA: Diagnosis not present

## 2018-12-07 DIAGNOSIS — E119 Type 2 diabetes mellitus without complications: Secondary | ICD-10-CM | POA: Diagnosis not present

## 2018-12-07 DIAGNOSIS — G8929 Other chronic pain: Secondary | ICD-10-CM | POA: Insufficient documentation

## 2018-12-07 LAB — POCT GLYCOSYLATED HEMOGLOBIN (HGB A1C): Hemoglobin A1C: 6 % — AB (ref 4.0–5.6)

## 2018-12-07 LAB — URIC ACID: Uric Acid, Serum: 7.5 mg/dL — ABNORMAL HIGH (ref 2.4–7.0)

## 2018-12-07 MED ORDER — HYDROXYZINE HCL 25 MG PO TABS: ORAL_TABLET | ORAL | 0 refills | Status: DC

## 2018-12-07 NOTE — Patient Instructions (Addendum)
Stop by the lab prior to leaving today. I will notify you of your results once received.   Continue Metformin 500 mg BID for diabetes.  Start exercising. You should be getting 150 minutes of moderate intensity exercise weekly.  Continue to work on a healthy diet.   Please schedule a physical with me in 6 months.. You may also schedule a lab only appointment 3-4 days prior. We will discuss your lab results in detail during your physical.  It was a pleasure to see you today!

## 2018-12-07 NOTE — Progress Notes (Signed)
Subjective:    Patient ID: Kayla Robles, female    DOB: Dec 16, 1970, 48 y.o.   MRN: [id number]  HPI  Kayla Robles is a 48 year old female who presents today for follow up.  1) Type 2 Diabetes:   Current medications include: Metformin 500 mg BID. Also managed on gabapentin 100-200 mg HS for neuropathy.   She is not checking her blood glucose levels.  Last A1C: 6.1 in February 2020, 6.0 today Last Eye Exam: Completed in 2020 Last Foot Exam: Due in February 2021 Pneumonia Vaccination: Never completed, declines  ACE/ARB: Lisinopril  Statin: atorvastatin   BP Readings from Last 3 Encounters:  12/07/18 130/82  09/11/18 128/84  03/08/18 130/86     2) GAD: Currently managed on Venlafaxine ER 187.5 mg which was increased during her last visit in August 2020 for symptoms of feeling anxious, body aches, fatigue, difficulty sleeping, irritability. Also managed on hydroxyzine PRN.   Since her last visit she stopped the increased dose of 37.5 mg due to GI upset and is doing well overall. She is doing mindfulness reading and is working to reduce worry. She is participating in therapy. She is using her hydroxyzine infrequently.   3) Chronic Foot Pain: Chronic for the last year. Symptoms include "burning and sticking" to bilateral plantar feet at the toes and ball of foot. She is taking gabapentin 200 mg HS which has helped some. She thinks she may have gout for symptoms of pain to the right first metatarsal toe joint. This has occurred several times this past year, never diagnosed with gout.     Review of Systems  Eyes: Negative for visual disturbance.  Respiratory: Negative for shortness of breath.   Cardiovascular: Negative for chest pain.  Musculoskeletal: Positive for arthralgias.  Neurological: Negative for dizziness.       Past Medical History:  Diagnosis Date  . Anxiety   . Hypertension   . Kidney stones   . S/P cholecystectomy t  . S/P tubal ligation 1992      Social History   Socioeconomic History  . Marital status: Married    Spouse name: Not on file  . Number of children: Not on file  . Years of education: Not on file  . Highest education level: Not on file  Occupational History  . Not on file  Social Needs  . Financial resource strain: Not on file  . Food insecurity    Worry: Not on file    Inability: Not on file  . Transportation needs    Medical: Not on file    Non-medical: Not on file  Tobacco Use  . Smoking status: Never Smoker  . Smokeless tobacco: Never Used  Substance and Sexual Activity  . Alcohol use: No  . Drug use: No  . Sexual activity: Not on file  Lifestyle  . Physical activity    Days per week: Not on file    Minutes per session: Not on file  . Stress: Not on file  Relationships  . Social Herbalist on phone: Not on file    Gets together: Not on file    Attends religious service: Not on file    Active member of club or organization: Not on file    Attends meetings of clubs or organizations: Not on file    Relationship status: Not on file  . Intimate partner violence    Fear of current or ex partner: Not on file  Emotionally abused: Not on file    Physically abused: Not on file    Forced sexual activity: Not on file  Other Topics Concern  . Not on file  Social History Narrative   Married.   Works as a Air traffic controller.   Enjoys playing with her grandchildren.    Past Surgical History:  Procedure Laterality Date  . CHOLECYSTECTOMY    . LITHOTRIPSY      Family History  Problem Relation Age of Onset  . Arthritis Mother   . Heart disease Mother   . Hypertension Mother   . Hypertension Father   . Diabetes Father     Allergies  Allergen Reactions  . Levaquin [Levofloxacin] Anaphylaxis    "felt weird" felt worse than usually felt  . Zithromax [Azithromycin] Anaphylaxis  . Other Other (See Comments)    Most anxiety medications  . Paroxetine Hcl Other (See Comments)    suicidal     Current Outpatient Medications on File Prior to Visit  Medication Sig Dispense Refill  . acetaminophen (TYLENOL) 325 MG tablet Take 650 mg by mouth every 6 (six) hours as needed for mild pain.    Marland Kitchen atorvastatin (LIPITOR) 10 MG tablet Take 1 tablet by mouth every evening for cholesterol. 90 tablet 1  . gabapentin (NEURONTIN) 100 MG capsule TAKE 1 TO 2 CAPSULES AT BEDTIME FOR NEUROPATHY 180 capsule 0  . hydrochlorothiazide (HYDRODIURIL) 25 MG tablet TAKE 1 TABLET DAILY 90 tablet 1  . lisinopril (ZESTRIL) 10 MG tablet TAKE 1 TABLET DAILY FOR HIGH BLOOD PRESSURE 90 tablet 1  . metFORMIN (GLUCOPHAGE) 500 MG tablet TAKE 1 TABLET TWICE A DAY WITH MEALS FOR DIABETES 180 tablet 1  . venlafaxine XR (EFFEXOR-XR) 150 MG 24 hr capsule TAKE 1 CAPSULE DAILY WITH BREAKFAST 90 capsule 1  . venlafaxine XR (EFFEXOR-XR) 37.5 MG 24 hr capsule Take 1 capsule (37.5 mg total) by mouth daily with breakfast. For anxiety. 30 capsule 0   No current facility-administered medications on file prior to visit.     BP 130/82   Pulse 83   Temp (!) 97.5 F (36.4 C) (Temporal)   Ht 5\' 2"  (1.575 m)   Wt 192 lb 1 oz (87.1 kg)   SpO2 98%   BMI 35.13 kg/m    Objective:   Physical Exam  Constitutional: She appears well-nourished.  Neck: Neck supple.  Cardiovascular: Normal rate and regular rhythm.  Respiratory: Effort normal and breath sounds normal.  Skin: Skin is warm and dry.  Psychiatric: She has a normal mood and affect.           Assessment & Plan:

## 2018-12-07 NOTE — Telephone Encounter (Signed)
Patient evaluated and medication refilled.

## 2018-12-07 NOTE — Assessment & Plan Note (Addendum)
Never took additional 37.5 mg of venlafaxine, doing well with non pharmacological techniques. Continue hydroxyzine PRN. Continue to monitor.

## 2018-12-07 NOTE — Assessment & Plan Note (Signed)
A1C today of 6.0 which is stable. Continue Metformin 500 mg BID.  Managed on statin and ACE. Declines pneumonia vaccination. Eye and foot exam UTD.  Continue gabapentin as needed. Continue metformin.  Follow up in 6 months.

## 2018-12-07 NOTE — Assessment & Plan Note (Signed)
Stable in the office today, continue current regimen. 

## 2018-12-07 NOTE — Assessment & Plan Note (Signed)
Sounds to have two different types of pain, one more suspicious of neuropathy and the other suspicious for gout.  Check uric acid level. Continue gabapentin.

## 2018-12-08 MED ORDER — LOSARTAN POTASSIUM 100 MG PO TABS: 100.0000 mg | ORAL_TABLET | Freq: Every day | ORAL | 0 refills | Status: DC

## 2019-01-04 ENCOUNTER — Other Ambulatory Visit: Payer: Self-pay

## 2019-01-04 DIAGNOSIS — Z20822 Contact with and (suspected) exposure to covid-19: Secondary | ICD-10-CM

## 2019-01-08 ENCOUNTER — Ambulatory Visit: Payer: Self-pay | Admitting: Primary Care

## 2019-01-08 NOTE — Telephone Encounter (Addendum)
Pt called and is requesting to have more information about her possibly testing positive for COVID; she was tested on 01/04/2019, and her results are pending; she says that her husband and people who have been around her at work have been getting sick; explained that she should be in quarantine until her results return, and the requirements to stop quarantine are: All persons with fever and respiratory symptoms should isolate themselves until ALL conditions listed below are met: - at least 10 days since symptoms onset - AND 3 consecutive days fever free without antipyretics (acetaminophen [Tylenol] or ibuprofen [Advil]) - AND improvement in respiratory symptoms Pt informed results are available first in MyChart, and she will receive a call regarding her results; pt advise to answer all calls; ptt advised to discuss return to work with her HR, and further concerns with her PCP; she verbalized understanding; she sees Alma Friendly, [place]; will route to office for notification.

## 2019-01-08 NOTE — Telephone Encounter (Signed)
  Reason for Disposition . General information question, no triage required and triager able to answer question  Answer Assessment - Initial Assessment Questions 1. REASON FOR CALL or QUESTION: "What is your reason for calling today?" or "How can I best help you?" or "What question do you have that I can help answer?"     COVID test questions  Protocols used: INFORMATION ONLY CALL - NO TRIAGE-A-AH

## 2019-01-08 NOTE — Telephone Encounter (Signed)
Agree to quarantine until results or until she meets all three criteria.

## 2019-01-09 ENCOUNTER — Telehealth: Payer: Self-pay | Admitting: Primary Care

## 2019-01-09 LAB — NOVEL CORONAVIRUS, NAA

## 2019-01-09 NOTE — Telephone Encounter (Signed)
Noted  

## 2019-01-09 NOTE — Telephone Encounter (Signed)
Pt called stating her spouse show her HR dept the results she received.  They stated she could go back tonight.

## 2019-01-09 NOTE — Telephone Encounter (Signed)
Pt called very upset that her test for covid was lost.  She stated she needs to go back to work.  She stated she feel better today except for her nose running.  She stated she nausea/dry heaving and diarrhea.  Soreness underneath one of her eyes.  She wants to know what she needs to do. Does she need to go back to be retested.  She wants to know if she could get a rapid test since her test was lost   She kept saying she needs to go back to work.

## 2019-02-17 ENCOUNTER — Other Ambulatory Visit: Payer: Self-pay | Admitting: Primary Care

## 2019-02-17 DIAGNOSIS — F411 Generalized anxiety disorder: Secondary | ICD-10-CM

## 2019-02-17 DIAGNOSIS — E1140 Type 2 diabetes mellitus with diabetic neuropathy, unspecified: Secondary | ICD-10-CM

## 2019-02-17 DIAGNOSIS — I10 Essential (primary) hypertension: Secondary | ICD-10-CM

## 2019-02-21 ENCOUNTER — Other Ambulatory Visit: Payer: Self-pay | Admitting: Primary Care

## 2019-02-21 DIAGNOSIS — F411 Generalized anxiety disorder: Secondary | ICD-10-CM

## 2019-02-21 DIAGNOSIS — E119 Type 2 diabetes mellitus without complications: Secondary | ICD-10-CM

## 2019-03-30 ENCOUNTER — Other Ambulatory Visit: Payer: Self-pay

## 2019-03-30 ENCOUNTER — Encounter: Payer: Self-pay | Admitting: Primary Care

## 2019-03-30 ENCOUNTER — Ambulatory Visit (INDEPENDENT_AMBULATORY_CARE_PROVIDER_SITE_OTHER): Payer: BC Managed Care – PPO | Admitting: Primary Care

## 2019-03-30 VITALS — BP 160/90 | HR 83 | Temp 96.8°F | Ht 62.0 in | Wt 204.8 lb

## 2019-03-30 DIAGNOSIS — I10 Essential (primary) hypertension: Secondary | ICD-10-CM | POA: Diagnosis not present

## 2019-03-30 MED ORDER — LISINOPRIL 20 MG PO TABS: 20.0000 mg | ORAL_TABLET | Freq: Every day | ORAL | 0 refills | Status: DC

## 2019-03-30 NOTE — Progress Notes (Signed)
Subjective:    Patient ID: Kayla Robles, female    DOB: 1970/07/23, 49 y.o.   MRN: [id number]  HPI  This visit occurred during the SARS-CoV-2 public health emergency.  Safety protocols were in place, including screening questions prior to the visit, additional usage of staff PPE, and extensive cleaning of exam room while observing appropriate contact time as indicated for disinfecting solutions.   Kayla Robles is a 49 year old female with a history of hypertension, type 2 diabetes, anxiety disorder who presents today with a chief complaint of joint aches, swelling.   She stopped taking her losartan yesterday as she thinks that her symptoms were secondary to losartan as they began when she started this medication nearly four months ago. Symptoms include "fluid retention" to fingers, generalized joint aches, bilateral foot swelling. Her symptoms today have improved since she skipped her dose yesterday.   She cannot tolerate HCTZ due to chronic gout and flares that occur when taking HCTZ; she was once on lisinopril and didn't have side effects.   BP Readings from Last 3 Encounters:  03/30/19 (!) 160/90  12/07/18 130/82  09/11/18 128/84     Review of Systems  Respiratory: Negative for shortness of breath.   Cardiovascular: Negative for chest pain.       Pedal edema  Musculoskeletal: Positive for myalgias.  Neurological: Negative for headaches.       Past Medical History:  Diagnosis Date  . Anxiety   . Hypertension   . Kidney stones   . S/P cholecystectomy t  . S/P tubal ligation 1992     Social History   Socioeconomic History  . Marital status: Married    Spouse name: Not on file  . Number of children: Not on file  . Years of education: Not on file  . Highest education level: Not on file  Occupational History  . Not on file  Tobacco Use  . Smoking status: Never Smoker  . Smokeless tobacco: Never Used  Substance and Sexual Activity  . Alcohol use: No  . Drug  use: No  . Sexual activity: Not on file  Other Topics Concern  . Not on file  Social History Narrative   Married.   Works as a Air traffic controller.   Enjoys playing with her grandchildren.   Social Determinants of Health   Financial Resource Strain:   . Difficulty of Paying Living Expenses: Not on file  Food Insecurity:   . Worried About Programme researcher, broadcasting/film/video in the Last Year: Not on file  . Ran Out of Food in the Last Year: Not on file  Transportation Needs:   . Lack of Transportation (Medical): Not on file  . Lack of Transportation (Non-Medical): Not on file  Physical Activity:   . Days of Exercise per Week: Not on file  . Minutes of Exercise per Session: Not on file  Stress:   . Feeling of Stress : Not on file  Social Connections:   . Frequency of Communication with Friends and Family: Not on file  . Frequency of Social Gatherings with Friends and Family: Not on file  . Attends Religious Services: Not on file  . Active Member of Clubs or Organizations: Not on file  . Attends Banker Meetings: Not on file  . Marital Status: Not on file  Intimate Partner Violence:   . Fear of Current or Ex-Partner: Not on file  . Emotionally Abused: Not on file  . Physically Abused: Not on  file  . Sexually Abused: Not on file    Past Surgical History:  Procedure Laterality Date  . CHOLECYSTECTOMY    . LITHOTRIPSY      Family History  Problem Relation Age of Onset  . Arthritis Mother   . Heart disease Mother   . Hypertension Mother   . Hypertension Father   . Diabetes Father     Allergies  Allergen Reactions  . Levaquin [Levofloxacin] Anaphylaxis    "felt weird" felt worse than usually felt  . Zithromax [Azithromycin] Anaphylaxis  . Other Other (See Comments)    Most anxiety medications  . Paroxetine Hcl Other (See Comments)    suicidal    Current Outpatient Medications on File Prior to Visit  Medication Sig Dispense Refill  . acetaminophen (TYLENOL) 325 MG tablet  Take 650 mg by mouth every 6 (six) hours as needed for mild pain.    Marland Kitchen gabapentin (NEURONTIN) 100 MG capsule TAKE 1 TO 2 CAPSULES AT BEDTIME FOR NEUROPATHY 180 capsule 0  . hydrOXYzine (ATARAX/VISTARIL) 25 MG tablet TAKE 1 TABLET ONCE NIGHTLY FOR ANXIETY / SLEEP 90 tablet 0  . metFORMIN (GLUCOPHAGE) 500 MG tablet TAKE 1 TABLET TWICE A DAY WITH MEALS FOR DIABETES 180 tablet 1  . venlafaxine XR (EFFEXOR-XR) 150 MG 24 hr capsule TAKE 1 CAPSULE DAILY WITH BREAKFAST 90 capsule 0   No current facility-administered medications on file prior to visit.    BP (!) 160/90   Pulse 83   Temp (!) 96.8 F (36 C) (Temporal)   Ht 5\' 2"  (1.575 m)   Wt 204 lb 12 oz (92.9 kg)   SpO2 98%   BMI 37.45 kg/m    Objective:   Physical Exam  Constitutional: She appears well-nourished.  Cardiovascular: Normal rate and regular rhythm.  Respiratory: Effort normal and breath sounds normal.  Musculoskeletal:     Cervical back: Neck supple.  Skin: Skin is warm and dry.  Psychiatric: She has a normal mood and affect.           Assessment & Plan:

## 2019-03-30 NOTE — Patient Instructions (Signed)
Start lisinopril 20 mg tablets once daily for high blood pressure.   Start monitoring your blood pressure daily, around the same time of day, for the next 2-3 weeks.  Ensure that you have rested for 30 minutes prior to checking your blood pressure. Record your readings and send readings via My Chart in 2 weeks.  Please update me if you continue to have problems.  It was a pleasure to see you today!

## 2019-03-30 NOTE — Assessment & Plan Note (Addendum)
Uncontrolled off of losartan 100 mg, but cannot tolerate side effects.  Will stop losartan, resume lisinopril 20 mg.  She will monitor BP at home and send readings via My Chart in 2 weeks.

## 2019-04-04 DIAGNOSIS — B9689 Other specified bacterial agents as the cause of diseases classified elsewhere: Secondary | ICD-10-CM | POA: Diagnosis not present

## 2019-04-04 DIAGNOSIS — J019 Acute sinusitis, unspecified: Secondary | ICD-10-CM | POA: Diagnosis not present

## 2019-04-04 DIAGNOSIS — Z889 Allergy status to unspecified drugs, medicaments and biological substances status: Secondary | ICD-10-CM | POA: Diagnosis not present

## 2019-04-04 DIAGNOSIS — K122 Cellulitis and abscess of mouth: Secondary | ICD-10-CM | POA: Diagnosis not present

## 2019-04-20 ENCOUNTER — Ambulatory Visit: Payer: BC Managed Care – PPO | Attending: Internal Medicine

## 2019-04-20 DIAGNOSIS — Z23 Encounter for immunization: Secondary | ICD-10-CM

## 2019-04-20 NOTE — Progress Notes (Signed)
   Covid-19 Vaccination Clinic  Name:  LOURDES KUCHARSKI    MRN: [id number] DOB: Dec 03, 1970  04/20/2019  Ms. Mandarino was observed post Covid-19 immunization for 15 minutes without incident. She was provided with Vaccine Information Sheet and instruction to access the V-Safe system.   Ms. Grieb was instructed to call 911 with any severe reactions post vaccine: Marland Kitchen Difficulty breathing  . Swelling of face and throat  . A fast heartbeat  . A bad rash all over body  . Dizziness and weakness   Immunizations Administered    Name Date Dose VIS Date Route   Pfizer COVID-19 Vaccine 04/20/2019  8:48 AM 0.3 mL 01/05/2019 Intramuscular   Manufacturer: [employer], [employer]   Lot: [id number]   NDC: [address]-8

## 2019-04-22 ENCOUNTER — Other Ambulatory Visit: Payer: Self-pay | Admitting: Primary Care

## 2019-04-22 DIAGNOSIS — I10 Essential (primary) hypertension: Secondary | ICD-10-CM

## 2019-04-30 ENCOUNTER — Other Ambulatory Visit: Payer: Self-pay | Admitting: Primary Care

## 2019-04-30 DIAGNOSIS — F411 Generalized anxiety disorder: Secondary | ICD-10-CM

## 2019-05-07 DIAGNOSIS — E1140 Type 2 diabetes mellitus with diabetic neuropathy, unspecified: Secondary | ICD-10-CM

## 2019-05-08 MED ORDER — GABAPENTIN 100 MG PO CAPS: 200.0000 mg | ORAL_CAPSULE | Freq: Two times a day (BID) | ORAL | 1 refills | Status: DC

## 2019-05-09 ENCOUNTER — Other Ambulatory Visit: Payer: Self-pay

## 2019-05-09 DIAGNOSIS — I10 Essential (primary) hypertension: Secondary | ICD-10-CM

## 2019-05-09 MED ORDER — LISINOPRIL 20 MG PO TABS: 20.0000 mg | ORAL_TABLET | Freq: Every day | ORAL | 1 refills | Status: DC

## 2019-05-09 NOTE — Telephone Encounter (Signed)
Is[facility]le [facility] [place] [facility] requesting refill lisinopril 20 mg # 90 x 3 e [facility].  Pt received # 30 to local pharmacy on 04/25/19; see office note on 03/30/18 and pt message on 04/25/19.

## 2019-05-16 ENCOUNTER — Ambulatory Visit: Payer: BC Managed Care – PPO | Attending: Internal Medicine

## 2019-05-16 DIAGNOSIS — Z23 Encounter for immunization: Secondary | ICD-10-CM

## 2019-05-16 NOTE — Progress Notes (Signed)
   Covid-19 Vaccination Clinic  Name:  NANCEE BROWNRIGG    MRN: [id number] DOB: Mar 23, 1970  05/16/2019  Ms. Wisnewski was observed post Covid-19 immunization for 15 minutes without incident. She was provided with Vaccine Information Sheet and instruction to access the V-Safe system.   Ms. Kantor was instructed to call 911 with any severe reactions post vaccine: Marland Kitchen Difficulty breathing  . Swelling of face and throat  . A fast heartbeat  . A bad rash all over body  . Dizziness and weakness   Immunizations Administered    Name Date Dose VIS Date Route   Pfizer COVID-19 Vaccine 05/16/2019  8:45 AM 0.3 mL 03/21/2018 Intramuscular   Manufacturer: [employer], [employer]   Lot: [id number]   NDC: [address]-3

## 2019-06-06 ENCOUNTER — Ambulatory Visit (INDEPENDENT_AMBULATORY_CARE_PROVIDER_SITE_OTHER): Payer: BC Managed Care – PPO | Admitting: Primary Care

## 2019-06-06 ENCOUNTER — Encounter: Payer: Self-pay | Admitting: Primary Care

## 2019-06-06 ENCOUNTER — Other Ambulatory Visit: Payer: Self-pay

## 2019-06-06 VITALS — BP 126/80 | HR 88 | Temp 95.8°F | Ht 62.0 in | Wt 205.5 lb

## 2019-06-06 DIAGNOSIS — G8929 Other chronic pain: Secondary | ICD-10-CM

## 2019-06-06 DIAGNOSIS — E119 Type 2 diabetes mellitus without complications: Secondary | ICD-10-CM | POA: Diagnosis not present

## 2019-06-06 DIAGNOSIS — M79673 Pain in unspecified foot: Secondary | ICD-10-CM

## 2019-06-06 DIAGNOSIS — L292 Pruritus vulvae: Secondary | ICD-10-CM | POA: Diagnosis not present

## 2019-06-06 DIAGNOSIS — F411 Generalized anxiety disorder: Secondary | ICD-10-CM | POA: Diagnosis not present

## 2019-06-06 DIAGNOSIS — Z0000 Encounter for general adult medical examination without abnormal findings: Secondary | ICD-10-CM

## 2019-06-06 DIAGNOSIS — N898 Other specified noninflammatory disorders of vagina: Secondary | ICD-10-CM

## 2019-06-06 DIAGNOSIS — I10 Essential (primary) hypertension: Secondary | ICD-10-CM | POA: Diagnosis not present

## 2019-06-06 LAB — LIPID PANEL
Cholesterol: 242 mg/dL — ABNORMAL HIGH (ref 0–200)
HDL: 37 mg/dL — ABNORMAL LOW (ref >39.00)
NonHDL: 205.46
Total CHOL/HDL Ratio: 7
Triglycerides: 345 mg/dL — ABNORMAL HIGH (ref 0.0–149.0)
VLDL: 69 mg/dL — ABNORMAL HIGH (ref 0.0–40.0)

## 2019-06-06 LAB — COMPREHENSIVE METABOLIC PANEL
ALT: 46 U/L — ABNORMAL HIGH (ref 0–35)
AST: 30 U/L (ref 0–37)
Albumin: 4.7 g/dL (ref 3.5–5.2)
Alkaline Phosphatase: 67 U/L (ref 39–117)
BUN: 16 mg/dL (ref 6–23)
CO2: 27 mEq/L (ref 19–32)
Calcium: 10 mg/dL (ref 8.4–10.5)
Chloride: 101 mEq/L (ref 96–112)
Creatinine, Ser: 0.69 mg/dL (ref 0.40–1.20)
GFR: 89.82 mL/min (ref >60.00)
Glucose, Bld: 130 mg/dL — ABNORMAL HIGH (ref 70–99)
Potassium: 3.9 mEq/L (ref 3.5–5.1)
Sodium: 138 mEq/L (ref 135–145)
Total Bilirubin: 0.4 mg/dL (ref 0.2–1.2)
Total Protein: 7.9 g/dL (ref 6.0–8.3)

## 2019-06-06 LAB — POC URINALSYSI DIPSTICK (AUTOMATED)
Blood, UA: NEGATIVE
Glucose, UA: NEGATIVE
Ketones, UA: NEGATIVE
Leukocytes, UA: NEGATIVE
Nitrite, UA: NEGATIVE
Protein, UA: POSITIVE — AB
Spec Grav, UA: >=1.03 — AB (ref 1.010–1.025)
Urobilinogen, UA: 0.2 E.U./dL
pH, UA: 5.5 (ref 5.0–8.0)

## 2019-06-06 LAB — CBC
HCT: 41.9 % (ref 36.0–46.0)
Hemoglobin: 14.4 g/dL (ref 12.0–15.0)
MCHC: 34.4 g/dL (ref 30.0–36.0)
MCV: 89.4 fl (ref 78.0–100.0)
Platelets: 334 10*3/uL (ref 150.0–400.0)
RBC: 4.68 Mil/uL (ref 3.87–5.11)
RDW: 12.8 % (ref 11.5–15.5)
WBC: 7.6 10*3/uL (ref 4.0–10.5)

## 2019-06-06 LAB — HEMOGLOBIN A1C: Hgb A1c MFr Bld: 6.3 % (ref 4.6–6.5)

## 2019-06-06 LAB — LDL CHOLESTEROL, DIRECT: Direct LDL: 142 mg/dL

## 2019-06-06 NOTE — Assessment & Plan Note (Signed)
Improved with increased dose of gabapentin, continue same.

## 2019-06-06 NOTE — Assessment & Plan Note (Signed)
Pneumonia and Shingles vaccines due, she declines today. Pap smear UTD. Mammogram overdue, she declines today despite recommendations. Colonoscopy due, she declines despite recommendations. Discussed the importance of a healthy diet and regular exercise in order for weight loss, and to reduce the risk of any potential medical problems.  Exam today unremarkable. Labs pending.

## 2019-06-06 NOTE — Assessment & Plan Note (Signed)
Stable in the office today, continue lisinopril 20 mg. CMP pending.  

## 2019-06-06 NOTE — Patient Instructions (Signed)
Stop by the lab prior to leaving today. I will notify you of your results once received.   We will be in touch regarding your vaginal swab and urine test.  Start exercising. You should be getting 150 minutes of moderate intensity exercise weekly.  It's important to improve your diet by reducing consumption of fast food, fried food, processed snack foods, sugary drinks. Increase consumption of fresh vegetables and fruits, whole grains, water.  Ensure you are drinking 64 ounces of water daily.  Please schedule a follow up appointment in 6 months for a diabetes check.   It was a pleasure to see you today!   Preventive Care 51-15 Years Old, Female Preventive care refers to visits with your health care provider and lifestyle choices that can promote health and wellness. This includes:  A yearly physical exam. This may also be called an annual well check.  Regular dental visits and eye exams.  Immunizations.  Screening for certain conditions.  Healthy lifestyle choices, such as eating a healthy diet, getting regular exercise, not using drugs or products that contain nicotine and tobacco, and limiting alcohol use. What can I expect for my preventive care visit? Physical exam Your health care provider will check your:  Height and weight. This may be used to calculate body mass index (BMI), which tells if you are at a healthy weight.  Heart rate and blood pressure.  Skin for abnormal spots. Counseling Your health care provider may ask you questions about your:  Alcohol, tobacco, and drug use.  Emotional well-being.  Home and relationship well-being.  Sexual activity.  Eating habits.  Work and work Statistician.  Method of birth control.  Menstrual cycle.  Pregnancy history. What immunizations do I need?  Influenza (flu) vaccine  This is recommended every year. Tetanus, diphtheria, and pertussis (Tdap) vaccine  You may need a Td booster every 10 years. Varicella  (chickenpox) vaccine  You may need this if you have not been vaccinated. Zoster (shingles) vaccine  You may need this after age 54. Measles, mumps, and rubella (MMR) vaccine  You may need at least one dose of MMR if you were born in 1957 or later. You may also need a second dose. Pneumococcal conjugate (PCV13) vaccine  You may need this if you have certain conditions and were not previously vaccinated. Pneumococcal polysaccharide (PPSV23) vaccine  You may need one or two doses if you smoke cigarettes or if you have certain conditions. Meningococcal conjugate (MenACWY) vaccine  You may need this if you have certain conditions. Hepatitis A vaccine  You may need this if you have certain conditions or if you travel or work in places where you may be exposed to hepatitis A. Hepatitis B vaccine  You may need this if you have certain conditions or if you travel or work in places where you may be exposed to hepatitis B. Haemophilus influenzae type b (Hib) vaccine  You may need this if you have certain conditions. Human papillomavirus (HPV) vaccine  If recommended by your health care provider, you may need three doses over 6 months. You may receive vaccines as individual doses or as more than one vaccine together in one shot (combination vaccines). Talk with your health care provider about the risks and benefits of combination vaccines. What tests do I need? Blood tests  Lipid and cholesterol levels. These may be checked every 5 years, or more frequently if you are over 19 years old.  Hepatitis C test.  Hepatitis B test. Screening  Lung cancer screening. You may have this screening every year starting at age 5 if you have a 30-pack-year history of smoking and currently smoke or have quit within the past 15 years.  Colorectal cancer screening. All adults should have this screening starting at age 51 and continuing until age 61. Your health care provider may recommend screening at  age 46 if you are at increased risk. You will have tests every 1-10 years, depending on your results and the type of screening test.  Diabetes screening. This is done by checking your blood sugar (glucose) after you have not eaten for a while (fasting). You may have this done every 1-3 years.  Mammogram. This may be done every 1-2 years. Talk with your health care provider about when you should start having regular mammograms. This may depend on whether you have a family history of breast cancer.  BRCA-related cancer screening. This may be done if you have a family history of breast, ovarian, tubal, or peritoneal cancers.  Pelvic exam and Pap test. This may be done every 3 years starting at age 5. Starting at age 9, this may be done every 5 years if you have a Pap test in combination with an HPV test. Other tests  Sexually transmitted disease (STD) testing.  Bone density scan. This is done to screen for osteoporosis. You may have this scan if you are at high risk for osteoporosis. Follow these instructions at home: Eating and drinking  Eat a diet that includes fresh fruits and vegetables, whole grains, lean protein, and low-fat dairy.  Take vitamin and mineral supplements as recommended by your health care provider.  Do not drink alcohol if: ? Your health care provider tells you not to drink. ? You are pregnant, may be pregnant, or are planning to become pregnant.  If you drink alcohol: ? Limit how much you have to 0-1 drink a day. ? Be aware of how much alcohol is in your drink. In the [place] one drink equals one 12 oz bottle of beer (355 mL), one 5 oz glass of wine (148 mL), or one 1 oz glass of hard liquor (44 mL). Lifestyle  Take daily care of your teeth and gums.  Stay active. Exercise for at least 30 minutes on 5 or more days each week.  Do not use any products that contain nicotine or tobacco, such as cigarettes, e-cigarettes, and chewing tobacco. If you need help quitting,  ask your health care provider.  If you are sexually active, practice safe sex. Use a condom or other form of birth control (contraception) in order to prevent pregnancy and STIs (sexually transmitted infections).  If told by your health care provider, take low-dose aspirin daily starting at age 4. What's next?  Visit your health care provider once a year for a well check visit.  Ask your health care provider how often you should have your eyes and teeth checked.  Stay up to date on all vaccines. This information is not intended to replace advice given to you by your health care provider. Make sure you discuss any questions you have with your health care provider. Document Revised: 09/22/2017 Document Reviewed: 09/22/2017 Elsevier Patient Education  2020 [employer].

## 2019-06-06 NOTE — Progress Notes (Signed)
Subjective:    Patient ID: Kayla Robles, female    DOB: Mar 02, 1970, 49 y.o.   MRN: [id number]  HPI  This visit occurred during the SARS-CoV-2 public health emergency.  Safety protocols were in place, including screening questions prior to the visit, additional usage of staff PPE, and extensive cleaning of exam room while observing appropriate contact time as indicated for disinfecting solutions.   Kayla Robles is a 49 year old female who presents today for complete physical.  She would also like to discuss vaginal itching that began a few days ago. Also with some vaginal burning. She denies vaginal discharge, urinary frequency, hematuria. She was treated about 6 weeks ago through telemedicine for "yeast infection" with fluconazole. Symptoms improved temporarily.   Immunizations: -Tetanus: Completed in 2019 -Influenza: Completed last season -Shingles: Never completed -Covid-19: Completed series -Pneumonia: Never completed, declines   Diet: She endorses a healthy diet.  Exercise: She is not exercising, but is active at work.  Eye exam: Completed in 2020 Dental exam: Completes semi-annually   Pap Smear: Completed in 2020 Mammogram: No recent mammogram, declines Colonoscopy: Never completed, declines  BP Readings from Last 3 Encounters:  06/06/19 126/80  03/30/19 (!) 160/90  12/07/18 130/82   Wt Readings from Last 3 Encounters:  06/06/19 205 lb 8 oz (93.2 kg)  03/30/19 204 lb 12 oz (92.9 kg)  12/07/18 192 lb 1 oz (87.1 kg)     Review of Systems  Constitutional: Negative for unexpected weight change.  HENT: Negative for rhinorrhea.   Respiratory: Negative for cough and shortness of breath.   Cardiovascular: Negative for chest pain.  Gastrointestinal: Negative for constipation and diarrhea.  Genitourinary: Negative for difficulty urinating, flank pain, frequency, hematuria and vaginal discharge.       Irregular menstrual cycles. Vaginal itching.  Musculoskeletal:  Negative for arthralgias.  Skin: Negative for rash.  Allergic/Immunologic: Negative for environmental allergies.  Neurological: Positive for numbness. Negative for dizziness and headaches.  Psychiatric/Behavioral: The patient is not nervous/anxious.        Past Medical History:  Diagnosis Date  . Anxiety   . Hypertension   . Kidney stones   . S/P cholecystectomy t  . S/P tubal ligation 1992     Social History   Socioeconomic History  . Marital status: Married    Spouse name: Not on file  . Number of children: Not on file  . Years of education: Not on file  . Highest education level: Not on file  Occupational History  . Not on file  Tobacco Use  . Smoking status: Never Smoker  . Smokeless tobacco: Never Used  Substance and Sexual Activity  . Alcohol use: No  . Drug use: No  . Sexual activity: Not on file  Other Topics Concern  . Not on file  Social History Narrative   Married.   Works as a Electrical engineer.   Enjoys playing with her grandchildren.   Social Determinants of Health   Financial Resource Strain:   . Difficulty of Paying Living Expenses:   Food Insecurity:   . Worried About Charity fundraiser in the Last Year:   . Arboriculturist in the Last Year:   Transportation Needs:   . Film/video editor (Medical):   Marland Kitchen Lack of Transportation (Non-Medical):   Physical Activity:   . Days of Exercise per Week:   . Minutes of Exercise per Session:   Stress:   . Feeling of Stress :  Social Connections:   . Frequency of Communication with Friends and Family:   . Frequency of Social Gatherings with Friends and Family:   . Attends Religious Services:   . Active Member of Clubs or Organizations:   . Attends Banker Meetings:   Marland Kitchen Marital Status:   Intimate Partner Violence:   . Fear of Current or Ex-Partner:   . Emotionally Abused:   Marland Kitchen Physically Abused:   . Sexually Abused:     Past Surgical History:  Procedure Laterality Date  .  CHOLECYSTECTOMY    . LITHOTRIPSY      Family History  Problem Relation Age of Onset  . Arthritis Mother   . Heart disease Mother   . Hypertension Mother   . Hypertension Father   . Diabetes Father     Allergies  Allergen Reactions  . Levaquin [Levofloxacin] Anaphylaxis    "felt weird" felt worse than usually felt  . Zithromax [Azithromycin] Anaphylaxis  . Other Other (See Comments)    Most anxiety medications  . Paroxetine Hcl Other (See Comments)    suicidal    Current Outpatient Medications on File Prior to Visit  Medication Sig Dispense Refill  . acetaminophen (TYLENOL) 325 MG tablet Take 650 mg by mouth every 6 (six) hours as needed for mild pain.    Marland Kitchen gabapentin (NEURONTIN) 100 MG capsule Take 2 capsules (200 mg total) by mouth 2 (two) times daily. For foot pain. 360 capsule 1  . hydrOXYzine (ATARAX/VISTARIL) 25 MG tablet TAKE 1 TABLET ONCE NIGHTLY FOR ANXIETY / SLEEP 90 tablet 0  . lisinopril (ZESTRIL) 20 MG tablet Take 1 tablet (20 mg total) by mouth daily. For blood pressure. 90 tablet 1  . metFORMIN (GLUCOPHAGE) 500 MG tablet TAKE 1 TABLET TWICE A DAY WITH MEALS FOR DIABETES 180 tablet 1  . venlafaxine XR (EFFEXOR-XR) 150 MG 24 hr capsule TAKE 1 CAPSULE DAILY WITH BREAKFAST 90 capsule 1   No current facility-administered medications on file prior to visit.    BP 126/80   Pulse 88   Temp (!) 95.8 F (35.4 C) (Temporal)   Ht 5\' 2"  (1.575 m)   Wt 205 lb 8 oz (93.2 kg)   SpO2 98%   BMI 37.59 kg/m    Objective:   Physical Exam  Constitutional: She is oriented to person, place, and time. She appears well-nourished.  HENT:  Right Ear: Tympanic membrane and ear canal normal.  Left Ear: Tympanic membrane and ear canal normal.  Mouth/Throat: Oropharynx is clear and moist.  Eyes: Pupils are equal, round, and reactive to light. EOM are normal.  Cardiovascular: Normal rate and regular rhythm.  Respiratory: Effort normal and breath sounds normal.  GI: Soft. Bowel  sounds are normal. There is no abdominal tenderness.  Musculoskeletal:        General: Normal range of motion.     Cervical back: Neck supple.  Neurological: She is alert and oriented to person, place, and time. No cranial nerve deficit.  Reflex Scores:      Patellar reflexes are 2+ on the right side and 2+ on the left side. Skin: Skin is warm and dry.  Psychiatric: She has a normal mood and affect.           Assessment & Plan:

## 2019-06-06 NOTE — Assessment & Plan Note (Signed)
Acute for the last several days, treated 6 weeks ago (without labs) for vaginal yeast infection. Wet prep and UA pending. Await results.

## 2019-06-06 NOTE — Assessment & Plan Note (Signed)
Improved since she has been laid off from work. Continue venlafaxine XR 150 mg.  She will update if anything changes.

## 2019-06-06 NOTE — Assessment & Plan Note (Signed)
Repeat A1C pending today. Compliant to metformin BID as prescribed.  Declines pneumonia vaccine.  Eye exam UTD. Foot exam today. Managed on ACE, lipid panel pending.  Follow up in 6 months.

## 2019-06-07 ENCOUNTER — Other Ambulatory Visit: Payer: Self-pay | Admitting: Primary Care

## 2019-06-07 DIAGNOSIS — D485 Neoplasm of uncertain behavior of skin: Secondary | ICD-10-CM | POA: Diagnosis not present

## 2019-06-07 DIAGNOSIS — D225 Melanocytic nevi of trunk: Secondary | ICD-10-CM | POA: Diagnosis not present

## 2019-06-07 DIAGNOSIS — B373 Candidiasis of vulva and vagina: Secondary | ICD-10-CM

## 2019-06-07 DIAGNOSIS — B3731 Acute candidiasis of vulva and vagina: Secondary | ICD-10-CM

## 2019-06-07 DIAGNOSIS — D179 Benign lipomatous neoplasm, unspecified: Secondary | ICD-10-CM | POA: Diagnosis not present

## 2019-06-07 LAB — URINE CULTURE
MICRO NUMBER:: 10469364
Result:: NO GROWTH
SPECIMEN QUALITY:: ADEQUATE

## 2019-06-07 LAB — WET PREP BY MOLECULAR PROBE
Candida species: DETECTED — AB
Gardnerella vaginalis: NOT DETECTED
MICRO NUMBER:: 10469365
SPECIMEN QUALITY:: ADEQUATE
Trichomonas vaginosis: NOT DETECTED

## 2019-06-07 MED ORDER — FLUCONAZOLE 150 MG PO TABS: 150.0000 mg | ORAL_TABLET | Freq: Once | ORAL | 0 refills | Status: AC

## 2019-06-18 DIAGNOSIS — B3731 Acute candidiasis of vulva and vagina: Secondary | ICD-10-CM

## 2019-06-18 DIAGNOSIS — B373 Candidiasis of vulva and vagina: Secondary | ICD-10-CM

## 2019-06-18 MED ORDER — FLUCONAZOLE 150 MG PO TABS: 150.0000 mg | ORAL_TABLET | Freq: Once | ORAL | 0 refills | Status: AC

## 2019-07-27 ENCOUNTER — Other Ambulatory Visit: Payer: Self-pay | Admitting: Primary Care

## 2019-07-27 DIAGNOSIS — F411 Generalized anxiety disorder: Secondary | ICD-10-CM

## 2019-08-17 DIAGNOSIS — B373 Candidiasis of vulva and vagina: Secondary | ICD-10-CM

## 2019-08-17 DIAGNOSIS — B3731 Acute candidiasis of vulva and vagina: Secondary | ICD-10-CM

## 2019-08-17 MED ORDER — FLUCONAZOLE 150 MG PO TABS: 150.0000 mg | ORAL_TABLET | Freq: Once | ORAL | 0 refills | Status: AC

## 2019-09-09 ENCOUNTER — Other Ambulatory Visit: Payer: Self-pay | Admitting: Primary Care

## 2019-09-09 DIAGNOSIS — E119 Type 2 diabetes mellitus without complications: Secondary | ICD-10-CM

## 2019-10-13 ENCOUNTER — Other Ambulatory Visit: Payer: Self-pay | Admitting: Primary Care

## 2019-10-13 DIAGNOSIS — I10 Essential (primary) hypertension: Secondary | ICD-10-CM

## 2019-10-15 ENCOUNTER — Other Ambulatory Visit: Payer: Self-pay | Admitting: Primary Care

## 2019-10-15 DIAGNOSIS — E1140 Type 2 diabetes mellitus with diabetic neuropathy, unspecified: Secondary | ICD-10-CM

## 2019-10-15 DIAGNOSIS — F411 Generalized anxiety disorder: Secondary | ICD-10-CM

## 2019-12-04 ENCOUNTER — Telehealth: Payer: Self-pay

## 2019-12-04 NOTE — Telephone Encounter (Signed)
Noted, will evaluate. 

## 2019-12-04 NOTE — Telephone Encounter (Signed)
[facility] Day - Client TELEPHONE ADVICE RECORD [facility] Patient Name: Kayla Robles Gender: Female DOB: [birth year] Age: 49 Y 1 M 25 D Return Phone Number: [phone] (Primary) Address[place] [address] Client [facility] Day - Client Client Site [facility] [place] - Day Physician Vernona Rieger - NP Contact Type Call Who Is Calling Patient / Member / Family / Caregiver Call Type Triage / Clinical Relationship To Patient Self Return Phone Number [phone] (Primary) Chief Complaint CHEST PAIN (>=21 years) - pain, pressure, heaviness or tightness Reason for Call Symptomatic / Request for Health Information Initial Comment Caller states she has chest congestion, headache, nasal congestion and hurts to breathe chest pain. Call transferred from office. she does have virtual visit tomorrow. Translation No Nurse Assessment Nurse: Annye English, RN, Denise Date/Time (Eastern Time): 12/04/2019 10:28:21 AM Confirm and document reason for call. If symptomatic, describe symptoms. ---Caller states she has chest congestion, headache, nasal congestion and hurts to breathe chest pain. Does the patient have any new or worsening symptoms? ---Yes Will a triage be completed? ---Yes Related visit to physician within the last 2 weeks? ---No Does the PT have any chronic conditions? (i.e. diabetes, asthma, this includes High risk factors for pregnancy, etc.) ---No Is the patient pregnant or possibly pregnant? (Ask all females between the ages of 70-55) ---No Is this a behavioral health or substance abuse call? ---No Guidelines Guideline Title Affirmed Question Affirmed Notes Nurse Date/Time Lamount Cohen Time) Common Cold Earache Carmon, RN, Denise 12/04/2019 10:29:23 AM Disp. Time Lamount Cohen Time) Disposition Final User 12/04/2019 10:26:18 AM Send to Urgent Ned Grace 12/04/2019 10:32:34 AM See PCP within  24 Hours Yes Carmon, RN, Leighton Ruff Disagree/Comply Comply PLEASE NOTE: All timestamps contained within this report are represented as [place] Standard Time. CONFIDENTIALTY NOTICE: This fax transmission is intended only for the addressee. It contains information that is legally privileged, confidential or otherwise protected from use or disclosure. If you are not the intended recipient, you are strictly prohibited from reviewing, disclosing, copying using or disseminating any of this information or taking any action in reliance on or regarding this information. If you have received this fax in error, please notify us immediately by telephone so that we can arrange for its return to [place]. Phone: [phone], Toll-Free: [phone], Fax: [phone] Page: 2 of 2 Call Id: [id number] Caller Understands Yes PreDisposition Call Doctor Care Advice Given Per Guideline SEE PCP WITHIN 24 HOURS: PAIN MEDICINES: * IBUPROFEN (E.G., MOTRIN, ADVIL): Take 400 mg (two 200 mg pills) by mouth every 6 hours. The most you should take each day is 1,200 mg (six 200 mg pills), unless your doctor has told you to take more. NASAL WASHES FOR A STUFFY NOSE: * Introduction: Saline (salt water) nasal irrigation (nasal wash) is an effective and simple home remedy for treating stuffy nose and sinus congestion. The nose can be irrigated by pouring, spraying, or squirting salt water into the nose and then letting it run back out. CALL BACK IF: * You become worse CARE ADVICE given per Common Cold (Adult) guideline. Comments User: Greggory Stallion, RN Date/Time Lamount Cohen Time): 12/04/2019 10:32:20 AM Advised to drink warm fluids to make cough more productive. Pt states she has a telehealth appt tomorrow w/pcp. Referrals REFERRED TO PCP OFFICE

## 2019-12-04 NOTE — Telephone Encounter (Signed)
Per appt notes pt already has video visit scheduled 12/05/19 at 8:20 with Allayne Gitelman NP.

## 2019-12-05 ENCOUNTER — Telehealth (INDEPENDENT_AMBULATORY_CARE_PROVIDER_SITE_OTHER): Payer: BC Managed Care – PPO | Admitting: Primary Care

## 2019-12-05 ENCOUNTER — Encounter: Payer: Self-pay | Admitting: Primary Care

## 2019-12-05 VITALS — BP 125/75 | HR 89 | Temp 98.5°F

## 2019-12-05 DIAGNOSIS — J309 Allergic rhinitis, unspecified: Secondary | ICD-10-CM | POA: Diagnosis not present

## 2019-12-05 DIAGNOSIS — Z03818 Encounter for observation for suspected exposure to other biological agents ruled out: Secondary | ICD-10-CM | POA: Diagnosis not present

## 2019-12-05 DIAGNOSIS — Z1152 Encounter for screening for COVID-19: Secondary | ICD-10-CM | POA: Diagnosis not present

## 2019-12-05 MED ORDER — MOMETASONE FUROATE 50 MCG/ACT NA SUSP: 1.0000 | Freq: Two times a day (BID) | NASAL | 0 refills | Status: DC | PRN

## 2019-12-05 NOTE — Patient Instructions (Signed)
Nasal Congestion/Ear Pressure/Sinus Pressure: Try using Nasonex nasal spray. Instill 1 spray in each nostril twice daily.   Stop taking Sudafed and Zyrtec-D.  Start plain Zyrtec.  Please notify me of your Covid-19 results.   It was a pleasure to see you today! Mayra Reel, NP-C

## 2019-12-05 NOTE — Assessment & Plan Note (Signed)
Intermittent symptoms of what sounds to be secondary to allergies/weather changes for Fall season. She doesn't appear sickly. Her cough and chest congestion have improved.  Discussed to stop Sudafed and Zyrtec-D to prevent rebound symptoms. Rx for Nasonex provided for ear pressure/nasal symptoms. Start plain Zyrtec 10 mg for PND and other allergy symptoms.  She will complete a rapid Covid-19 test today and update with results.  She will also update if symptoms progress with proposed treatment.

## 2019-12-05 NOTE — Progress Notes (Signed)
Subjective:    Patient ID: Kayla Robles, female    DOB: Mar 07, 1970, 49 y.o.   MRN: [id number]  HPI  Virtual Visit via Video Note  I connected with Kayla Robles on 12/05/19 at  8:20 AM EST by a video enabled telemedicine application and verified that I am speaking with the correct person using two identifiers.  Location: Patient: Home Provider: Office Participants: Patient and myself   I discussed the limitations of evaluation and management by telemedicine and the availability of in person appointments. The patient expressed understanding and agreed to proceed.  History of Present Illness:  Kayla Robles is a 49 year old female with a history of GAD, type 2 diabetes, hypertension who presents today with a chief complaint of ear pain.  She also reports sneezing, post nasal drip, ear pain, rhinorrhea, cough, diarrhea. Symptoms began two weeks ago which have been intermittent. She works in a cold atmosphere at work which aggravates rhinorrhea, ear pain/pressure. Also colder weather in general seems to aggravate symptoms.  She's taken Robitussin with improvement in chest congestion and cough.  She's also been taking Sudafed and Zyrtec-D for about 7 days. Today she's feeling a lot better in her chest.  She denies loss of taste/smell, fevers, known exposure to Covid-19. She has been fully vaccinated against Covid-19, has not been tested for Covid-19 recently.    Observations/Objective:  Alert and oriented. Appears well, not sickly. No distress. Speaking in complete sentences. No cough.   Assessment and Plan:  Intermittent symptoms of what sounds to be secondary to allergies/weather changes for Fall season. She doesn't appear sickly. Her cough and chest congestion have improved.  Discussed to stop Sudafed and Zyrtec-D to prevent rebound symptoms. Rx for Nasonex provided for ear pressure/nasal symptoms. Start plain Zyrtec 10 mg for PND and other allergy symptoms.  She  will complete a rapid Covid-19 test today and update with results.  She will also update if symptoms progress with proposed treatment.  Follow Up Instructions:  Nasal Congestion/Ear Pressure/Sinus Pressure: Try using Nasonex nasal spray. Instill 1 spray in each nostril twice daily.   Stop taking Sudafed and Zyrtec-D.  Start plain Zyrtec.  Please notify me of your Covid-19 results.   It was a pleasure to see you today! Kayla Reel, NP-C    I discussed the assessment and treatment plan with the patient. The patient was provided an opportunity to ask questions and all were answered. The patient agreed with the plan and demonstrated an understanding of the instructions.   The patient was advised to call back or seek an in-person evaluation if the symptoms worsen or if the condition fails to improve as anticipated.    Doreene Nest, NP    Review of Systems  Constitutional: Negative for fatigue and fever.  HENT: Positive for congestion, ear pain, postnasal drip and sneezing.   Respiratory: Positive for cough and chest tightness.   Cardiovascular: Negative for chest pain.  Allergic/Immunologic: Positive for environmental allergies.       Past Medical History:  Diagnosis Date  . Anxiety   . Hypertension   . Kidney stones   . S/P cholecystectomy t  . S/P tubal ligation 1992     Social History   Socioeconomic History  . Marital status: Married    Spouse name: Not on file  . Number of children: Not on file  . Years of education: Not on file  . Highest education level: Not on file  Occupational History  .  Not on file  Tobacco Use  . Smoking status: Never Smoker  . Smokeless tobacco: Never Used  Substance and Sexual Activity  . Alcohol use: No  . Drug use: No  . Sexual activity: Not on file  Other Topics Concern  . Not on file  Social History Narrative   Married.   Works as a Air traffic controller.   Enjoys playing with her grandchildren.   Social Determinants of  Health   Financial Resource Strain:   . Difficulty of Paying Living Expenses: Not on file  Food Insecurity:   . Worried About Programme researcher, broadcasting/film/video in the Last Year: Not on file  . Ran Out of Food in the Last Year: Not on file  Transportation Needs:   . Lack of Transportation (Medical): Not on file  . Lack of Transportation (Non-Medical): Not on file  Physical Activity:   . Days of Exercise per Week: Not on file  . Minutes of Exercise per Session: Not on file  Stress:   . Feeling of Stress : Not on file  Social Connections:   . Frequency of Communication with Friends and Family: Not on file  . Frequency of Social Gatherings with Friends and Family: Not on file  . Attends Religious Services: Not on file  . Active Member of Clubs or Organizations: Not on file  . Attends Banker Meetings: Not on file  . Marital Status: Not on file  Intimate Partner Violence:   . Fear of Current or Ex-Partner: Not on file  . Emotionally Abused: Not on file  . Physically Abused: Not on file  . Sexually Abused: Not on file    Past Surgical History:  Procedure Laterality Date  . CHOLECYSTECTOMY    . LITHOTRIPSY      Family History  Problem Relation Age of Onset  . Arthritis Mother   . Heart disease Mother   . Hypertension Mother   . Hypertension Father   . Diabetes Father     Allergies  Allergen Reactions  . Levaquin [Levofloxacin] Anaphylaxis    "felt weird" felt worse than usually felt  . Zithromax [Azithromycin] Anaphylaxis  . Other Other (See Comments)    Most anxiety medications  . Paroxetine Hcl Other (See Comments)    suicidal    Current Outpatient Medications on File Prior to Visit  Medication Sig Dispense Refill  . acetaminophen (TYLENOL) 325 MG tablet Take 650 mg by mouth every 6 (six) hours as needed for mild pain.    Marland Kitchen gabapentin (NEURONTIN) 100 MG capsule TAKE 2 CAPSULES TWICE A DAY FOR FOOT PAIN 360 capsule 3  . hydrOXYzine (ATARAX/VISTARIL) 25 MG tablet  TAKE 1 TABLET ONCE NIGHTLY FOR ANXIETY / SLEEP 90 tablet 1  . lisinopril (ZESTRIL) 20 MG tablet TAKE 1 TABLET DAILY FOR BLOOD PRESSURE 90 tablet 3  . metFORMIN (GLUCOPHAGE) 500 MG tablet TAKE 1 TABLET TWICE A DAY WITH MEALS FOR DIABETES 180 tablet 1  . venlafaxine XR (EFFEXOR-XR) 150 MG 24 hr capsule TAKE 1 CAPSULE DAILY WITH BREAKFAST 90 capsule 3   No current facility-administered medications on file prior to visit.    BP 125/75   Pulse 89   Temp 98.5 F (36.9 C) (Oral)    Objective:   Physical Exam Constitutional:      General: She is not in acute distress.    Appearance: She is not ill-appearing.  Pulmonary:     Effort: Pulmonary effort is normal.     Comments: No cough  during visit Neurological:     Mental Status: She is alert and oriented to person, place, and time.  Psychiatric:        Mood and Affect: Mood normal.            Assessment & Plan:

## 2019-12-07 ENCOUNTER — Ambulatory Visit: Payer: BC Managed Care – PPO | Admitting: Primary Care

## 2020-01-07 ENCOUNTER — Other Ambulatory Visit: Payer: Self-pay | Admitting: Primary Care

## 2020-01-07 DIAGNOSIS — F411 Generalized anxiety disorder: Secondary | ICD-10-CM

## 2020-02-20 ENCOUNTER — Other Ambulatory Visit: Payer: Self-pay | Admitting: Primary Care

## 2020-02-20 DIAGNOSIS — E119 Type 2 diabetes mellitus without complications: Secondary | ICD-10-CM

## 2020-02-20 NOTE — Telephone Encounter (Signed)
Pharmacy requests refill on: Metformin 500 mg   LAST REFILL: 09/11/2019 (Q-180, R-1) LAST OV: 12/05/2019  NEXT OV: Not Scheduled  PHARMACY: [facility]  Hgb A1C (06/06/2019): 6.3

## 2020-05-22 ENCOUNTER — Ambulatory Visit: Payer: BC Managed Care – PPO | Admitting: Primary Care

## 2020-05-23 ENCOUNTER — Other Ambulatory Visit: Payer: Self-pay

## 2020-05-23 ENCOUNTER — Encounter: Payer: Self-pay | Admitting: Primary Care

## 2020-05-23 ENCOUNTER — Ambulatory Visit (INDEPENDENT_AMBULATORY_CARE_PROVIDER_SITE_OTHER): Payer: BC Managed Care – PPO | Admitting: Primary Care

## 2020-05-23 DIAGNOSIS — F411 Generalized anxiety disorder: Secondary | ICD-10-CM

## 2020-05-23 DIAGNOSIS — F4321 Adjustment disorder with depressed mood: Secondary | ICD-10-CM | POA: Diagnosis not present

## 2020-05-23 NOTE — Progress Notes (Signed)
Subjective:    Patient ID: Kayla Robles, female    DOB: 09-07-1970, 50 y.o.   MRN: [id number]  HPI  Kayla Robles is a very pleasant 50 y.o. female with a history of hypertension, type 2 diabetes, GAD who presents today to discuss FMLA.  History of anxiety and depression that date back to when her father passed away 20 years ago. Over the years she's seen therapy and has been on medication which have helped.   She lost her mother recently which has triggered her anxiety and depression which include inconsistent sleep, feeling overwhelmed, feeling stressed. She also contracted Covid around that time and has since recovered. Since her mother's passing she's also had to clean out her mother's home, plan the funeral without any help from her sister.   Her employer "messed up my bereavement" and so she had to take some vacation days to remain out of work. She continues to feel tense and scared about falling into a deep depresion, has been sleeping a lot over the last three days, decrease in appetite.  She's compliant to her venlafaxine Er 150 mg.   She had bereavement on 04/20, 04/21, and 04/24. She took a vacation day on 04/25 because she wasn't ready to return. She returned to work on 04/26 which was very difficult, could focus on her job, felt overwhelmed and tired. She's been out of work on 04/27, 04/28.  Today she's feeling somewhat better since she's been able to rest and adapt to the circumstances. She is needing FMLA to cover for any further incidences of missing work due to her symptoms.     Review of Systems  Constitutional: Positive for fatigue.  Respiratory: Negative for shortness of breath.   Cardiovascular: Negative for chest pain.  Psychiatric/Behavioral:       See HPI         Past Medical History:  Diagnosis Date  . Anxiety   . Hypertension   . Kidney stones   . S/P cholecystectomy t  . S/P tubal ligation 1992    Social History   Socioeconomic History   . Marital status: Married    Spouse name: Not on file  . Number of children: Not on file  . Years of education: Not on file  . Highest education level: Not on file  Occupational History  . Not on file  Tobacco Use  . Smoking status: Never Smoker  . Smokeless tobacco: Never Used  Substance and Sexual Activity  . Alcohol use: No  . Drug use: No  . Sexual activity: Not on file  Other Topics Concern  . Not on file  Social History Narrative   Married.   Works as a Air traffic controller.   Enjoys playing with her grandchildren.   Social Determinants of Health   Financial Resource Strain: Not on file  Food Insecurity: Not on file  Transportation Needs: Not on file  Physical Activity: Not on file  Stress: Not on file  Social Connections: Not on file  Intimate Partner Violence: Not on file    Past Surgical History:  Procedure Laterality Date  . CHOLECYSTECTOMY    . LITHOTRIPSY      Family History  Problem Relation Age of Onset  . Arthritis Mother   . Heart disease Mother   . Hypertension Mother   . Hypertension Father   . Diabetes Father     Allergies  Allergen Reactions  . Levaquin [Levofloxacin] Anaphylaxis    "felt weird" felt worse  than usually felt  . Zithromax [Azithromycin] Anaphylaxis  . Other Other (See Comments)    Most anxiety medications  . Paroxetine Hcl Other (See Comments)    suicidal    Current Outpatient Medications on File Prior to Visit  Medication Sig Dispense Refill  . acetaminophen (TYLENOL) 325 MG tablet Take 650 mg by mouth every 6 (six) hours as needed for mild pain.    Marland Kitchen gabapentin (NEURONTIN) 100 MG capsule TAKE 2 CAPSULES TWICE A DAY FOR FOOT PAIN 360 capsule 3  . hydrOXYzine (ATARAX/VISTARIL) 25 MG tablet TAKE 1 TABLET ONCE NIGHTLY FOR ANXIETY / SLEEP 90 tablet 1  . lisinopril (ZESTRIL) 20 MG tablet TAKE 1 TABLET DAILY FOR BLOOD PRESSURE 90 tablet 3  . metFORMIN (GLUCOPHAGE) 500 MG tablet TAKE 1 TABLET TWICE A DAY WITH MEALS FOR DIABETES  180 tablet 0  . venlafaxine XR (EFFEXOR-XR) 150 MG 24 hr capsule TAKE 1 CAPSULE DAILY WITH BREAKFAST 90 capsule 3  . mometasone (NASONEX) 50 MCG/ACT nasal spray Place 1 spray into the nose 2 (two) times daily as needed. (Patient not taking: Reported on 05/23/2020) 1 each 0   No current facility-administered medications on file prior to visit.    BP 126/80   Pulse 87   Temp 97.7 F (36.5 C) (Temporal)   Ht 5\' 2"  (1.575 m)   Wt 205 lb (93 kg)   SpO2 94%   BMI 37.49 kg/m  Objective:   Physical Exam Cardiovascular:     Rate and Rhythm: Normal rate and regular rhythm.  Pulmonary:     Effort: Pulmonary effort is normal.     Breath sounds: Normal breath sounds.  Musculoskeletal:     Cervical back: Neck supple.  Skin:    General: Skin is warm and dry.           Assessment & Plan:      This visit occurred during the SARS-CoV-2 public health emergency.  Safety protocols were in place, including screening questions prior to the visit, additional usage of staff PPE, and extensive cleaning of exam room while observing appropriate contact time as indicated for disinfecting solutions.

## 2020-05-23 NOTE — Assessment & Plan Note (Signed)
Appears to be appropriately grieving, she actually seems to be optimistic today.  Reassurance provided that she is going through a normal grieving process. I do believe that venlafaxine ER 150 mg is helping, continue same.  I offered therapy for which she declines due to lack of insurance. Intermittent FMLA provided to use if needed. Will provide 4 days off monthly for 3 months.

## 2020-05-23 NOTE — Assessment & Plan Note (Signed)
Overall seems to be doing okay on venlafaxine ER 150 mg, continue same.

## 2020-05-23 NOTE — Patient Instructions (Signed)
Please have your employer send over the [facility] paperwork.  Continue venlafaxine ER 150 mg.   It was a pleasure to see you today!

## 2020-05-26 ENCOUNTER — Telehealth: Payer: Self-pay | Admitting: Primary Care

## 2020-05-26 NOTE — Telephone Encounter (Signed)
Received FMLA paperwork via fax put in [place] box for processing. EM

## 2020-05-26 NOTE — Telephone Encounter (Signed)
Paperwork semi completed and placed in PCP's inbox for review, completion, sign and date

## 2020-05-26 NOTE — Telephone Encounter (Signed)
LVM for pt to call me about FMLA paperwork 

## 2020-05-27 NOTE — Telephone Encounter (Signed)
Left message to return call to our office.  

## 2020-05-27 NOTE — Telephone Encounter (Signed)
Please let pt know that FMLA will be addressed with Doreene Nest, NP returns to the office next week

## 2020-05-29 NOTE — Telephone Encounter (Signed)
Called patient let her know we will call when done. She wanted to make sure that the FMLA will be good for a year.

## 2020-06-01 DIAGNOSIS — H6692 Otitis media, unspecified, left ear: Secondary | ICD-10-CM | POA: Diagnosis not present

## 2020-06-01 DIAGNOSIS — J209 Acute bronchitis, unspecified: Secondary | ICD-10-CM | POA: Diagnosis not present

## 2020-06-02 ENCOUNTER — Other Ambulatory Visit: Payer: Self-pay | Admitting: Primary Care

## 2020-06-02 NOTE — Telephone Encounter (Signed)
Paperwork has been completed and placed in Kayla Robles's inbox.  During our visit we agreed for a three month duration which is very reasonable. If she feels that she needs additional time after the 3 months then we can meet back to discuss, but this should be an adequate amount of time.

## 2020-06-03 NOTE — Telephone Encounter (Signed)
LVM to inform pt that FMLA was completed and faxed Also informed it was for 3 months not 1 year  Copy mailed to pt  Copy for scan   Copy retained by me

## 2020-06-09 ENCOUNTER — Other Ambulatory Visit: Payer: BC Managed Care – PPO

## 2020-06-11 ENCOUNTER — Other Ambulatory Visit: Payer: Self-pay | Admitting: Primary Care

## 2020-06-11 DIAGNOSIS — E119 Type 2 diabetes mellitus without complications: Secondary | ICD-10-CM

## 2020-06-18 ENCOUNTER — Ambulatory Visit (INDEPENDENT_AMBULATORY_CARE_PROVIDER_SITE_OTHER): Payer: BC Managed Care – PPO | Admitting: Primary Care

## 2020-06-18 ENCOUNTER — Other Ambulatory Visit: Payer: Self-pay

## 2020-06-18 ENCOUNTER — Encounter: Payer: Self-pay | Admitting: Primary Care

## 2020-06-18 VITALS — BP 110/64 | HR 93 | Temp 98.1°F | Ht 62.0 in | Wt 204.0 lb

## 2020-06-18 DIAGNOSIS — J309 Allergic rhinitis, unspecified: Secondary | ICD-10-CM

## 2020-06-18 DIAGNOSIS — M79673 Pain in unspecified foot: Secondary | ICD-10-CM

## 2020-06-18 DIAGNOSIS — F411 Generalized anxiety disorder: Secondary | ICD-10-CM | POA: Diagnosis not present

## 2020-06-18 DIAGNOSIS — E119 Type 2 diabetes mellitus without complications: Secondary | ICD-10-CM

## 2020-06-18 DIAGNOSIS — I10 Essential (primary) hypertension: Secondary | ICD-10-CM | POA: Diagnosis not present

## 2020-06-18 DIAGNOSIS — G8929 Other chronic pain: Secondary | ICD-10-CM

## 2020-06-18 DIAGNOSIS — Z0000 Encounter for general adult medical examination without abnormal findings: Secondary | ICD-10-CM

## 2020-06-18 LAB — CBC
HCT: 37.3 % (ref 36.0–46.0)
Hemoglobin: 13 g/dL (ref 12.0–15.0)
MCHC: 34.9 g/dL (ref 30.0–36.0)
MCV: 88.6 fl (ref 78.0–100.0)
Platelets: 276 10*3/uL (ref 150.0–400.0)
RBC: 4.21 Mil/uL (ref 3.87–5.11)
RDW: 12.9 % (ref 11.5–15.5)
WBC: 10.1 10*3/uL (ref 4.0–10.5)

## 2020-06-18 LAB — COMPREHENSIVE METABOLIC PANEL
ALT: 27 U/L (ref 0–35)
AST: 19 U/L (ref 0–37)
Albumin: 4.4 g/dL (ref 3.5–5.2)
Alkaline Phosphatase: 67 U/L (ref 39–117)
BUN: 14 mg/dL (ref 6–23)
CO2: 28 mEq/L (ref 19–32)
Calcium: 9.7 mg/dL (ref 8.4–10.5)
Chloride: 102 mEq/L (ref 96–112)
Creatinine, Ser: 0.62 mg/dL (ref 0.40–1.20)
GFR: 102.91 mL/min (ref >60.00)
Glucose, Bld: 98 mg/dL (ref 70–99)
Potassium: 4.2 mEq/L (ref 3.5–5.1)
Sodium: 139 mEq/L (ref 135–145)
Total Bilirubin: 0.5 mg/dL (ref 0.2–1.2)
Total Protein: 7.6 g/dL (ref 6.0–8.3)

## 2020-06-18 LAB — LIPID PANEL
Cholesterol: 202 mg/dL — ABNORMAL HIGH (ref 0–200)
HDL: 32.3 mg/dL — ABNORMAL LOW (ref >39.00)
NonHDL: 169.5
Total CHOL/HDL Ratio: 6
Triglycerides: 323 mg/dL — ABNORMAL HIGH (ref 0.0–149.0)
VLDL: 64.6 mg/dL — ABNORMAL HIGH (ref 0.0–40.0)

## 2020-06-18 LAB — HEMOGLOBIN A1C: Hgb A1c MFr Bld: 6.6 % — ABNORMAL HIGH (ref 4.6–6.5)

## 2020-06-18 LAB — LDL CHOLESTEROL, DIRECT: Direct LDL: 112 mg/dL

## 2020-06-18 NOTE — Assessment & Plan Note (Signed)
Doing well, no recent use of Singulair.

## 2020-06-18 NOTE — Progress Notes (Signed)
Subjective:    Patient ID: Kayla Robles, female    DOB: May 30, 1970, 50 y.o.   MRN: [id number]  HPI  Kayla Robles is a very pleasant 50 y.o. female who presents today for complete physical.  She is needing an alteration in her FMLA date to change through December 31st. This is because her company uses her vacation and sick time up first for lost days, then they go into [employer].   Immunizations: -Tetanus: 2019 -Influenza: Did not complete last season  -Covid-19: Completed 2 vaccines -Shingles: Never completed, she will check on insurance coverage  Diet: She endorses a fair diet. Exercise: No regular exercise  Eye exam: Completes annually, due in August  Dental exam: Completes semi-annually   Pap Smear: 2020 Mammogram: No recent mammogram  Colonoscopy: Never completed   BP Readings from Last 3 Encounters:  06/18/20 110/64  05/23/20 126/80  12/05/19 125/75     Review of Systems  Constitutional: Negative for unexpected weight change.  HENT: Negative for rhinorrhea.   Eyes: Negative for visual disturbance.  Respiratory: Negative for cough and shortness of breath.   Cardiovascular: Negative for chest pain.  Gastrointestinal: Negative for constipation and diarrhea.  Genitourinary: Negative for difficulty urinating.  Musculoskeletal: Negative for arthralgias and myalgias.  Skin: Negative for rash.  Allergic/Immunologic: Negative for environmental allergies.  Neurological: Negative for dizziness, numbness and headaches.  Psychiatric/Behavioral: The patient is not nervous/anxious.          Past Medical History:  Diagnosis Date  . Anxiety   . Hypertension   . Kidney stones   . S/P cholecystectomy t  . S/P tubal ligation 1992    Social History   Socioeconomic History  . Marital status: Married    Spouse name: Not on file  . Number of children: Not on file  . Years of education: Not on file  . Highest education level: Not on file  Occupational History   . Not on file  Tobacco Use  . Smoking status: Never Smoker  . Smokeless tobacco: Never Used  Substance and Sexual Activity  . Alcohol use: No  . Drug use: No  . Sexual activity: Not on file  Other Topics Concern  . Not on file  Social History Narrative   Married.   Works as a Air traffic controller.   Enjoys playing with her grandchildren.   Social Determinants of Health   Financial Resource Strain: Not on file  Food Insecurity: Not on file  Transportation Needs: Not on file  Physical Activity: Not on file  Stress: Not on file  Social Connections: Not on file  Intimate Partner Violence: Not on file    Past Surgical History:  Procedure Laterality Date  . CHOLECYSTECTOMY    . LITHOTRIPSY      Family History  Problem Relation Age of Onset  . Arthritis Mother   . Heart disease Mother   . Hypertension Mother   . Hypertension Father   . Diabetes Father     Allergies  Allergen Reactions  . Levaquin [Levofloxacin] Anaphylaxis    "felt weird" felt worse than usually felt  . Zithromax [Azithromycin] Anaphylaxis  . Other Other (See Comments)    Most anxiety medications  . Paroxetine Hcl Other (See Comments)    suicidal    Current Outpatient Medications on File Prior to Visit  Medication Sig Dispense Refill  . acetaminophen (TYLENOL) 325 MG tablet Take 650 mg by mouth every 6 (six) hours as needed for mild pain.    Marland Kitchen  gabapentin (NEURONTIN) 100 MG capsule TAKE 2 CAPSULES TWICE A DAY FOR FOOT PAIN 360 capsule 3  . hydrOXYzine (ATARAX/VISTARIL) 25 MG tablet TAKE 1 TABLET ONCE NIGHTLY FOR ANXIETY / SLEEP 90 tablet 1  . lisinopril (ZESTRIL) 20 MG tablet TAKE 1 TABLET DAILY FOR BLOOD PRESSURE 90 tablet 3  . metFORMIN (GLUCOPHAGE) 500 MG tablet TAKE 1 TABLET TWICE A DAY WITH MEALS FOR DIABETES 180 tablet 0  . mometasone (NASONEX) 50 MCG/ACT nasal spray Place 1 spray into the nose 2 (two) times daily as needed. (Patient not taking: Reported on 05/23/2020) 1 each 0  . venlafaxine XR  (EFFEXOR-XR) 150 MG 24 hr capsule TAKE 1 CAPSULE DAILY WITH BREAKFAST 90 capsule 3   No current facility-administered medications on file prior to visit.    There were no vitals taken for this visit. Objective:   Physical Exam HENT:     Right Ear: Tympanic membrane and ear canal normal.     Left Ear: Tympanic membrane and ear canal normal.     Nose: Nose normal.  Eyes:     Conjunctiva/sclera: Conjunctivae normal.     Pupils: Pupils are equal, round, and reactive to light.  Neck:     Thyroid: No thyromegaly.  Cardiovascular:     Rate and Rhythm: Normal rate and regular rhythm.     Heart sounds: No murmur heard.   Pulmonary:     Effort: Pulmonary effort is normal.     Breath sounds: Normal breath sounds. No rales.  Abdominal:     General: Bowel sounds are normal.     Palpations: Abdomen is soft.     Tenderness: There is no abdominal tenderness.  Musculoskeletal:        General: Normal range of motion.     Cervical back: Neck supple.  Lymphadenopathy:     Cervical: No cervical adenopathy.  Skin:    General: Skin is warm and dry.     Findings: No rash.  Neurological:     Mental Status: She is alert and oriented to person, place, and time.     Cranial Nerves: No cranial nerve deficit.     Deep Tendon Reflexes: Reflexes are normal and symmetric.  Psychiatric:        Mood and Affect: Mood normal.           Assessment & Plan:      This visit occurred during the SARS-CoV-2 public health emergency.  Safety protocols were in place, including screening questions prior to the visit, additional usage of staff PPE, and extensive cleaning of exam room while observing appropriate contact time as indicated for disinfecting solutions.

## 2020-06-18 NOTE — Assessment & Plan Note (Signed)
Shingrix due, she will check on insurance coverage.  Mammogram overdue, declines despite recommendations. Colonoscopy overdue, declines despite recommendations.   Discussed the importance of a healthy diet and regular exercise in order for weight loss, and to reduce the risk of any potential medical problems.  Exam today stable. Labs pending.

## 2020-06-18 NOTE — Assessment & Plan Note (Signed)
Doing well on venlafaxine ER 150 mg daily, using hydroxyzine 25 mg PRN, more recently. Continue current regimen.

## 2020-06-18 NOTE — Assessment & Plan Note (Addendum)
Repeat A1C pending. Continue metformin 500 mg BID.  Managed on statin and ACE-I.  Pneumonia vaccine UTD. Foot exam due. Eye exam due this Summer.  Follow up in 6 months.

## 2020-06-18 NOTE — Telephone Encounter (Signed)
LVM for pt that FMLA paperwork has had dates amended to 01/24/2021 and faxed  Copy mailed to pt  Copy for scan   Copy retained by me

## 2020-06-18 NOTE — Assessment & Plan Note (Signed)
Well controlled in the office today, continue lisinopril 20 mg. CMP pending.  

## 2020-06-18 NOTE — Patient Instructions (Signed)
Stop by the lab prior to leaving today. I will notify you of your results once received.   Please think about the mammogram and colonoscopy.  Please schedule a follow up appointment in 6 months for diabetes check.   It was a pleasure to see you today!   Preventive Care 46-52 Years Old, Female Preventive care refers to lifestyle choices and visits with your health care provider that can promote health and wellness. This includes:  A yearly physical exam. This is also called an annual wellness visit.  Regular dental and eye exams.  Immunizations.  Screening for certain conditions.  Healthy lifestyle choices, such as: ? Eating a healthy diet. ? Getting regular exercise. ? Not using drugs or products that contain nicotine and tobacco. ? Limiting alcohol use. What can I expect for my preventive care visit? Physical exam Your health care provider will check your:  Height and weight. These may be used to calculate your BMI (body mass index). BMI is a measurement that tells if you are at a healthy weight.  Heart rate and blood pressure.  Body temperature.  Skin for abnormal spots. Counseling Your health care provider may ask you questions about your:  Past medical problems.  Family's medical history.  Alcohol, tobacco, and drug use.  Emotional well-being.  Home life and relationship well-being.  Sexual activity.  Diet, exercise, and sleep habits.  Work and work Statistician.  Access to firearms.  Method of birth control.  Menstrual cycle.  Pregnancy history. What immunizations do I need? Vaccines are usually given at various ages, according to a schedule. Your health care provider will recommend vaccines for you based on your age, medical history, and lifestyle or other factors, such as travel or where you work.   What tests do I need? Blood tests  Lipid and cholesterol levels. These may be checked every 5 years, or more often if you are over 11 years  old.  Hepatitis C test.  Hepatitis B test. Screening  Lung cancer screening. You may have this screening every year starting at age 24 if you have a 30-pack-year history of smoking and currently smoke or have quit within the past 15 years.  Colorectal cancer screening. ? All adults should have this screening starting at age 30 and continuing until age 20. ? Your health care provider may recommend screening at age 23 if you are at increased risk. ? You will have tests every 1-10 years, depending on your results and the type of screening test.  Diabetes screening. ? This is done by checking your blood sugar (glucose) after you have not eaten for a while (fasting). ? You may have this done every 1-3 years.  Mammogram. ? This may be done every 1-2 years. ? Talk with your health care provider about when you should start having regular mammograms. This may depend on whether you have a family history of breast cancer.  BRCA-related cancer screening. This may be done if you have a family history of breast, ovarian, tubal, or peritoneal cancers.  Pelvic exam and Pap test. ? This may be done every 3 years starting at age 51. ? Starting at age 51, this may be done every 5 years if you have a Pap test in combination with an HPV test. Other tests  STD (sexually transmitted disease) testing, if you are at risk.  Bone density scan. This is done to screen for osteoporosis. You may have this scan if you are at high risk for osteoporosis. Talk  with your health care provider about your test results, treatment options, and if necessary, the need for more tests. Follow these instructions at home: Eating and drinking  Eat a diet that includes fresh fruits and vegetables, whole grains, lean protein, and low-fat dairy products.  Take vitamin and mineral supplements as recommended by your health care provider.  Do not drink alcohol if: ? Your health care provider tells you not to drink. ? You are  pregnant, may be pregnant, or are planning to become pregnant.  If you drink alcohol: ? Limit how much you have to 0-1 drink a day. ? Be aware of how much alcohol is in your drink. In the [place] one drink equals one 12 oz bottle of beer (355 mL), one 5 oz glass of wine (148 mL), or one 1 oz glass of hard liquor (44 mL).   Lifestyle  Take daily care of your teeth and gums. Brush your teeth every morning and night with fluoride toothpaste. Floss one time each day.  Stay active. Exercise for at least 30 minutes 5 or more days each week.  Do not use any products that contain nicotine or tobacco, such as cigarettes, e-cigarettes, and chewing tobacco. If you need help quitting, ask your health care provider.  Do not use drugs.  If you are sexually active, practice safe sex. Use a condom or other form of protection to prevent STIs (sexually transmitted infections).  If you do not wish to become pregnant, use a form of birth control. If you plan to become pregnant, see your health care provider for a prepregnancy visit.  If told by your health care provider, take low-dose aspirin daily starting at age 49.  Find healthy ways to cope with stress, such as: ? Meditation, yoga, or listening to music. ? Journaling. ? Talking to a trusted person. ? Spending time with friends and family. Safety  Always wear your seat belt while driving or riding in a vehicle.  Do not drive: ? If you have been drinking alcohol. Do not ride with someone who has been drinking. ? When you are tired or distracted. ? While texting.  Wear a helmet and other protective equipment during sports activities.  If you have firearms in your house, make sure you follow all gun safety procedures. What's next?  Visit your health care provider once a year for an annual wellness visit.  Ask your health care provider how often you should have your eyes and teeth checked.  Stay up to date on all vaccines. This information is  not intended to replace advice given to you by your health care provider. Make sure you discuss any questions you have with your health care provider. Document Revised: 10/16/2019 Document Reviewed: 09/22/2017 Elsevier Patient Education  2021 [employer].

## 2020-06-18 NOTE — Assessment & Plan Note (Signed)
Overall doing well, is trying to wean off by taking gabapentin 100 mg every other day. She will update. Continue regimen.

## 2020-06-19 DIAGNOSIS — E785 Hyperlipidemia, unspecified: Secondary | ICD-10-CM

## 2020-06-19 MED ORDER — PRAVASTATIN SODIUM 40 MG PO TABS: 40.0000 mg | ORAL_TABLET | Freq: Every day | ORAL | 3 refills | Status: DC

## 2020-08-05 NOTE — Telephone Encounter (Signed)
Can we get her in to see me next week?

## 2020-08-06 NOTE — Telephone Encounter (Signed)
Left message to return call to our office.  

## 2020-08-07 MED ORDER — PROPRANOLOL HCL 10 MG PO TABS: 10.0000 mg | ORAL_TABLET | Freq: Every day | ORAL | 0 refills | Status: DC | PRN

## 2020-08-07 NOTE — Telephone Encounter (Signed)
Patient called back. Please see her mychart message update from today. Patient is doing better with the medication but just is very sleepy and wonders if she can try something else that maybe will not make her as groggy after. I went over [name]'s availability for next week and that vs patient's availability she would not be able to come in.  Please advise. Patient is aware that Jae Dire is out of the office until next week.

## 2020-08-08 NOTE — Telephone Encounter (Signed)
Called patient as well as sent my chart message to call office for follow up.

## 2020-08-12 ENCOUNTER — Other Ambulatory Visit: Payer: Self-pay

## 2020-08-12 ENCOUNTER — Ambulatory Visit (INDEPENDENT_AMBULATORY_CARE_PROVIDER_SITE_OTHER): Payer: BC Managed Care – PPO | Admitting: Primary Care

## 2020-08-12 ENCOUNTER — Encounter: Payer: Self-pay | Admitting: Primary Care

## 2020-08-12 VITALS — BP 124/80 | HR 62 | Temp 97.6°F | Ht 62.0 in | Wt 198.0 lb

## 2020-08-12 DIAGNOSIS — F411 Generalized anxiety disorder: Secondary | ICD-10-CM

## 2020-08-12 MED ORDER — BUSPIRONE HCL 5 MG PO TABS: 5.0000 mg | ORAL_TABLET | Freq: Two times a day (BID) | ORAL | 0 refills | Status: DC

## 2020-08-12 NOTE — Patient Instructions (Signed)
Continue taking venlafaxine ER 150 mg for anxiety and depression.  Start buspirone (Buspar) 5 mg twice daily for anxiety. Please update me in a few weeks.  It was a pleasure to see you today!

## 2020-08-12 NOTE — Progress Notes (Signed)
Subjective:    Patient ID: Kayla Robles, female    DOB: 02/07/1970, 50 y.o.   MRN: [id number]  HPI  Kayla Robles is a very pleasant 50 y.o. female with a history of GAD, hypertension, type 2 diabetes who presents today for follow up of anxiety.   She was last evaluated in May 2022, endorsed doing well on venlafaxine ER 150 mg daily and hydroxyzine PRN. She sent a message through MyChart endorsing increased anxiety, feeling more down. She lost her mother in April 2022 which triggered prior experiences of anxiety and depression when she lost her father 20 years prior. She was placed on FMLA at the time. Given her reports of increased symptoms we asked her to come in today.  Since her last visit she's noticed feeling more anxious, more "sad". Symptoms are worse when dealing with the executor of her mothers estate. She's been under a lot of stress, is worried about her husband's uncontrolled diabetes. She's mostly struggling with anxiety and "snapping at people". She does feel that the venlafaxine is effective overall, felt worse when not taking. She is mostly struggling with intermittent stressors. She's finding that hydroxyzine causes too much drowsiness, even at the 12.5 mg dose. She's exhausting her vacation time off due to her FMLA use.   She never picked up the propranolol as she was worried about the side effects.    Review of Systems  Respiratory:  Negative for shortness of breath.   Cardiovascular:  Negative for chest pain.  Psychiatric/Behavioral:  The patient is nervous/anxious.         Past Medical History:  Diagnosis Date   Anxiety    Hypertension    Kidney stones    S/P cholecystectomy t   S/P tubal ligation 1992    Social History   Socioeconomic History   Marital status: Married    Spouse name: Not on file   Number of children: Not on file   Years of education: Not on file   Highest education level: Not on file  Occupational History   Not on file   Tobacco Use   Smoking status: Never   Smokeless tobacco: Never  Substance and Sexual Activity   Alcohol use: No   Drug use: No   Sexual activity: Not on file  Other Topics Concern   Not on file  Social History Narrative   Married.   Works as a Air traffic controller.   Enjoys playing with her grandchildren.   Social Determinants of Health   Financial Resource Strain: Not on file  Food Insecurity: Not on file  Transportation Needs: Not on file  Physical Activity: Not on file  Stress: Not on file  Social Connections: Not on file  Intimate Partner Violence: Not on file    Past Surgical History:  Procedure Laterality Date   CHOLECYSTECTOMY     LITHOTRIPSY      Family History  Problem Relation Age of Onset   Arthritis Mother    Heart disease Mother    Hypertension Mother    Hypertension Father    Diabetes Father     Allergies  Allergen Reactions   Levaquin [Levofloxacin] Anaphylaxis    "felt weird" felt worse than usually felt   Zithromax [Azithromycin] Anaphylaxis   Other Other (See Comments)    Most anxiety medications   Paroxetine Hcl Other (See Comments)    suicidal    Current Outpatient Medications on File Prior to Visit  Medication Sig Dispense Refill  gabapentin (NEURONTIN) 100 MG capsule TAKE 2 CAPSULES TWICE A DAY FOR FOOT PAIN (Patient taking differently: Patient is taking one cap every other day) 360 capsule 3   hydrOXYzine (ATARAX/VISTARIL) 25 MG tablet TAKE 1 TABLET ONCE NIGHTLY FOR ANXIETY / SLEEP 90 tablet 1   lisinopril (ZESTRIL) 20 MG tablet TAKE 1 TABLET DAILY FOR BLOOD PRESSURE 90 tablet 3   metFORMIN (GLUCOPHAGE) 500 MG tablet TAKE 1 TABLET TWICE A DAY WITH MEALS FOR DIABETES 180 tablet 0   venlafaxine XR (EFFEXOR-XR) 150 MG 24 hr capsule TAKE 1 CAPSULE DAILY WITH BREAKFAST 90 capsule 3   No current facility-administered medications on file prior to visit.    BP 124/80   Pulse 62   Temp 97.6 F (36.4 C) (Temporal)   Ht 5\' 2"  (1.575 m)   Wt  198 lb (89.8 kg)   SpO2 97%   BMI 36.21 kg/m  Objective:   Physical Exam Cardiovascular:     Rate and Rhythm: Normal rate and regular rhythm.  Pulmonary:     Effort: Pulmonary effort is normal.     Breath sounds: Normal breath sounds.  Musculoskeletal:     Cervical back: Neck supple.  Skin:    General: Skin is warm and dry.  Psychiatric:        Mood and Affect: Mood normal.     Comments: Tearful at times during visit           Assessment & Plan:      This visit occurred during the SARS-CoV-2 public health emergency.  Safety protocols were in place, including screening questions prior to the visit, additional usage of staff PPE, and extensive cleaning of exam room while observing appropriate contact time as indicated for disinfecting solutions.

## 2020-08-12 NOTE — Assessment & Plan Note (Addendum)
Waxes and wanes, situational.  She does not want to try propranolol, will trial Buspar 5 mg BID. She will update. Consider Wellbutrin or Propranolol.

## 2020-08-22 ENCOUNTER — Other Ambulatory Visit: Payer: Self-pay | Admitting: Primary Care

## 2020-08-22 DIAGNOSIS — E119 Type 2 diabetes mellitus without complications: Secondary | ICD-10-CM

## 2020-08-27 ENCOUNTER — Telehealth: Payer: Self-pay | Admitting: Primary Care

## 2020-08-27 DIAGNOSIS — F411 Generalized anxiety disorder: Secondary | ICD-10-CM

## 2020-08-27 MED ORDER — BUSPIRONE HCL 5 MG PO TABS: 5.0000 mg | ORAL_TABLET | Freq: Two times a day (BID) | ORAL | 1 refills | Status: DC

## 2020-08-27 NOTE — Telephone Encounter (Signed)
  Encourage patient to contact the pharmacy for refills or they can request refills through [facility]  LAST APPOINTMENT DATE:  Please schedule appointment if longer than 1 year  NEXT APPOINTMENT DATE:  MEDICATION: busPIRone (BUSPAR) 5 MG tablet  Is the patient out of medication? yes  PHARMACY: [facility]  Let patient know to contact pharmacy at the end of the day to make sure medication is ready.  Please notify patient to allow 48-72 hours to process  CLINICAL FILLS OUT ALL BELOW:   LAST REFILL:  QTY:  REFILL DATE:    OTHER COMMENTS:    Okay for refill?  Please advise

## 2020-08-27 NOTE — Telephone Encounter (Signed)
Refills sent to pharmacy. 

## 2020-08-27 NOTE — Addendum Note (Signed)
Addended by: Doreene Nest on: 08/27/2020 03:48 PM   Modules accepted: Orders

## 2020-09-08 ENCOUNTER — Other Ambulatory Visit: Payer: Self-pay | Admitting: Primary Care

## 2020-09-08 DIAGNOSIS — F411 Generalized anxiety disorder: Secondary | ICD-10-CM

## 2020-09-29 ENCOUNTER — Other Ambulatory Visit: Payer: Self-pay | Admitting: Primary Care

## 2020-09-29 DIAGNOSIS — F411 Generalized anxiety disorder: Secondary | ICD-10-CM

## 2020-10-09 ENCOUNTER — Other Ambulatory Visit: Payer: Self-pay | Admitting: Primary Care

## 2020-10-09 DIAGNOSIS — I10 Essential (primary) hypertension: Secondary | ICD-10-CM

## 2020-12-30 DIAGNOSIS — N23 Unspecified renal colic: Secondary | ICD-10-CM | POA: Diagnosis not present

## 2020-12-30 DIAGNOSIS — R059 Cough, unspecified: Secondary | ICD-10-CM | POA: Diagnosis not present

## 2020-12-30 DIAGNOSIS — R300 Dysuria: Secondary | ICD-10-CM | POA: Diagnosis not present

## 2020-12-30 DIAGNOSIS — J069 Acute upper respiratory infection, unspecified: Secondary | ICD-10-CM | POA: Diagnosis not present

## 2021-01-01 ENCOUNTER — Emergency Department (HOSPITAL_COMMUNITY): Payer: BC Managed Care – PPO

## 2021-01-01 ENCOUNTER — Other Ambulatory Visit: Payer: Self-pay

## 2021-01-01 ENCOUNTER — Encounter (HOSPITAL_COMMUNITY): Payer: Self-pay | Admitting: Emergency Medicine

## 2021-01-01 ENCOUNTER — Emergency Department (HOSPITAL_COMMUNITY)
Admission: EM | Admit: 2021-01-01 | Discharge: 2021-01-01 | Disposition: A | Discharge status: Home / Self Care | Payer: BC Managed Care – PPO | Attending: Emergency Medicine | Admitting: Emergency Medicine

## 2021-01-01 DIAGNOSIS — R110 Nausea: Secondary | ICD-10-CM | POA: Diagnosis not present

## 2021-01-01 DIAGNOSIS — R109 Unspecified abdominal pain: Secondary | ICD-10-CM | POA: Diagnosis not present

## 2021-01-01 DIAGNOSIS — Z7984 Long term (current) use of oral hypoglycemic drugs: Secondary | ICD-10-CM | POA: Insufficient documentation

## 2021-01-01 DIAGNOSIS — Z79899 Other long term (current) drug therapy: Secondary | ICD-10-CM | POA: Diagnosis not present

## 2021-01-01 DIAGNOSIS — R1031 Right lower quadrant pain: Secondary | ICD-10-CM | POA: Diagnosis not present

## 2021-01-01 DIAGNOSIS — I10 Essential (primary) hypertension: Secondary | ICD-10-CM | POA: Diagnosis not present

## 2021-01-01 DIAGNOSIS — R319 Hematuria, unspecified: Secondary | ICD-10-CM | POA: Insufficient documentation

## 2021-01-01 DIAGNOSIS — I700 Atherosclerosis of aorta: Secondary | ICD-10-CM | POA: Diagnosis not present

## 2021-01-01 DIAGNOSIS — Z9049 Acquired absence of other specified parts of digestive tract: Secondary | ICD-10-CM | POA: Diagnosis not present

## 2021-01-01 DIAGNOSIS — K5730 Diverticulosis of large intestine without perforation or abscess without bleeding: Secondary | ICD-10-CM | POA: Diagnosis not present

## 2021-01-01 LAB — CBC WITH DIFFERENTIAL/PLATELET
Abs Immature Granulocytes: 0.02 10*3/uL (ref 0.00–0.07)
Basophils Absolute: 0 10*3/uL (ref 0.0–0.1)
Basophils Relative: 1 %
Eosinophils Absolute: 0.2 10*3/uL (ref 0.0–0.5)
Eosinophils Relative: 3 %
HCT: 37.4 % (ref 36.0–46.0)
Hemoglobin: 12.8 g/dL (ref 12.0–15.0)
Immature Granulocytes: 0 %
Lymphocytes Relative: 27 %
Lymphs Abs: 1.9 10*3/uL (ref 0.7–4.0)
MCH: 30.7 pg (ref 26.0–34.0)
MCHC: 34.2 g/dL (ref 30.0–36.0)
MCV: 89.7 fL (ref 80.0–100.0)
Monocytes Absolute: 0.6 10*3/uL (ref 0.1–1.0)
Monocytes Relative: 8 %
Neutro Abs: 4.3 10*3/uL (ref 1.7–7.7)
Neutrophils Relative %: 61 %
Platelets: 249 10*3/uL (ref 150–400)
RBC: 4.17 MIL/uL (ref 3.87–5.11)
RDW: 11.9 % (ref 11.5–15.5)
WBC: 7 10*3/uL (ref 4.0–10.5)
nRBC: 0 % (ref 0.0–0.2)

## 2021-01-01 LAB — URINALYSIS, ROUTINE W REFLEX MICROSCOPIC
Bilirubin Urine: NEGATIVE
Glucose, UA: NEGATIVE mg/dL
Hgb urine dipstick: NEGATIVE
Leukocytes,Ua: NEGATIVE
Nitrite: NEGATIVE
Protein, ur: NEGATIVE mg/dL
Specific Gravity, Urine: >=1.03 (ref 1.005–1.030)
pH: 5.5 (ref 5.0–8.0)

## 2021-01-01 LAB — COMPREHENSIVE METABOLIC PANEL
ALT: 31 U/L (ref 0–44)
AST: 28 U/L (ref 15–41)
Albumin: 4.2 g/dL (ref 3.5–5.0)
Alkaline Phosphatase: 61 U/L (ref 38–126)
Anion gap: 8 (ref 5–15)
BUN: 14 mg/dL (ref 6–20)
CO2: 24 mmol/L (ref 22–32)
Calcium: 9.3 mg/dL (ref 8.9–10.3)
Chloride: 107 mmol/L (ref 98–111)
Creatinine, Ser: 0.55 mg/dL (ref 0.44–1.00)
GFR, Estimated: >60 mL/min (ref >60)
Glucose, Bld: 117 mg/dL — ABNORMAL HIGH (ref 70–99)
Potassium: 3.5 mmol/L (ref 3.5–5.1)
Sodium: 139 mmol/L (ref 135–145)
Total Bilirubin: 0.6 mg/dL (ref 0.3–1.2)
Total Protein: 7.6 g/dL (ref 6.5–8.1)

## 2021-01-01 MED ORDER — NAPROXEN 500 MG PO TABS: 500.0000 mg | ORAL_TABLET | Freq: Once | ORAL | Status: DC

## 2021-01-01 NOTE — ED Provider Notes (Signed)
[facility] DEPT Provider Note: Georgena Spurling, MD, FACEP  CSN: [id number] MRN: [id number] ARRIVAL: 01/01/21 at [place]: [id number]/WLPT1   CHIEF COMPLAINT  Flank Pain   HISTORY OF PRESENT ILLNESS  01/01/21 2:57 AM Kayla Robles is a 50 y.o. female with a history of kidney stones.  She is here with right flank pain for the past 4 days.  The pain has been intermittent.  It has been severe at times.  It is slightly worse with palpation of her right flank or abdomen.  She has had associated hematuria and nausea but no vomiting.  Her pain has now significantly improved and her pain is mild at the present time.   Past Medical History:  Diagnosis Date   Anxiety    Hypertension    Kidney stones    S/P cholecystectomy t   S/P tubal ligation 1992    Past Surgical History:  Procedure Laterality Date   CHOLECYSTECTOMY     LITHOTRIPSY      Family History  Problem Relation Age of Onset   Arthritis Mother    Heart disease Mother    Hypertension Mother    Hypertension Father    Diabetes Father     Social History   Tobacco Use   Smoking status: Never   Smokeless tobacco: Never  Substance Use Topics   Alcohol use: No   Drug use: No    Prior to Admission medications   Medication Sig Start Date End Date Taking? Authorizing Provider  busPIRone (BUSPAR) 5 MG tablet Take 1 tablet (5 mg total) by mouth 2 (two) times daily. For anxiety 08/27/20   Pleas Koch, NP  gabapentin (NEURONTIN) 100 MG capsule TAKE 2 CAPSULES TWICE A DAY FOR FOOT PAIN Patient taking differently: Patient is taking one cap every other day 10/16/19   Pleas Koch, NP  hydrOXYzine (ATARAX/VISTARIL) 25 MG tablet TAKE 1 TABLET ONCE NIGHTLY FOR ANXIETY / SLEEP 01/07/20   Pleas Koch, NP  lisinopril (ZESTRIL) 20 MG tablet TAKE 1 TABLET DAILY FOR BLOOD PRESSURE 10/09/20   Pleas Koch, NP  metFORMIN (GLUCOPHAGE) 500 MG tablet TAKE 1 TABLET TWICE A DAY WITH MEALS FOR DIABETES 08/22/20   Pleas Koch, NP  venlafaxine XR (EFFEXOR-XR) 150 MG 24 hr capsule Take 1 capsule (150 mg total) by mouth daily with breakfast. For anxiety 09/30/20   Pleas Koch, NP    Allergies Levaquin [levofloxacin], Zithromax [azithromycin], Other, and Paroxetine hcl   REVIEW OF SYSTEMS  Negative except as noted here or in the History of Present Illness.   PHYSICAL EXAMINATION  Initial Vital Signs Blood pressure 136/84, pulse 76, temperature 97.6 F (36.4 C), temperature source Oral, resp. rate 16, last menstrual period 03/12/2016, SpO2 96 %.  Examination General: Well-developed, well-nourished female in no acute distress; appearance consistent with age of record HENT: normocephalic; atraumatic Eyes: Normal appearance Neck: supple Heart: regular rate and rhythm Lungs: clear to auscultation bilaterally Abdomen: soft; nondistended; mild right lower quadrant tenderness; bowel sounds present GU: Mild right CVA tenderness Extremities: No deformity; full range of motion Neurologic: Awake, alert and oriented; motor function intact in all extremities and symmetric; no facial droop Skin: Warm and dry Psychiatric: Normal mood and affect   RESULTS  Summary of this visit's results, reviewed and interpreted by myself:   EKG Interpretation  Date/Time:    Ventricular Rate:    PR Interval:    QRS Duration:   QT Interval:    QTC Calculation:  R Axis:     Text Interpretation:         Laboratory Studies: Results for orders placed or performed during the hospital encounter of 01/01/21 (from the past 24 hour(s))  Urinalysis, Routine w reflex microscopic Urine, Clean Catch     Status: Abnormal   Collection Time: 01/01/21  1:24 AM  Result Value Ref Range   Color, Urine YELLOW YELLOW   APPearance HAZY (A) CLEAR   Specific Gravity, Urine >=1.030 1.005 - 1.030   pH 5.5 5.0 - 8.0   Glucose, UA NEGATIVE NEGATIVE mg/dL   Hgb urine dipstick NEGATIVE NEGATIVE   Bilirubin Urine NEGATIVE  NEGATIVE   Ketones, ur TRACE (A) NEGATIVE mg/dL   Protein, ur NEGATIVE NEGATIVE mg/dL   Nitrite NEGATIVE NEGATIVE   Leukocytes,Ua NEGATIVE NEGATIVE   Bacteria, UA RARE (A) NONE SEEN   Squamous Epithelial / LPF 0-5 0 - 5   Mucus PRESENT   CBC with Differential     Status: None   Collection Time: 01/01/21  1:25 AM  Result Value Ref Range   WBC 7.0 4.0 - 10.5 K/uL   RBC 4.17 3.87 - 5.11 MIL/uL   Hemoglobin 12.8 12.0 - 15.0 g/dL   HCT 37.4 36.0 - 46.0 %   MCV 89.7 80.0 - 100.0 fL   MCH 30.7 26.0 - 34.0 pg   MCHC 34.2 30.0 - 36.0 g/dL   RDW 11.9 11.5 - 15.5 %   Platelets 249 150 - 400 K/uL   nRBC 0.0 0.0 - 0.2 %   Neutrophils Relative % 61 %   Neutro Abs 4.3 1.7 - 7.7 K/uL   Lymphocytes Relative 27 %   Lymphs Abs 1.9 0.7 - 4.0 K/uL   Monocytes Relative 8 %   Monocytes Absolute 0.6 0.1 - 1.0 K/uL   Eosinophils Relative 3 %   Eosinophils Absolute 0.2 0.0 - 0.5 K/uL   Basophils Relative 1 %   Basophils Absolute 0.0 0.0 - 0.1 K/uL   Immature Granulocytes 0 %   Abs Immature Granulocytes 0.02 0.00 - 0.07 K/uL  Comprehensive metabolic panel     Status: Abnormal   Collection Time: 01/01/21  1:25 AM  Result Value Ref Range   Sodium 139 135 - 145 mmol/L   Potassium 3.5 3.5 - 5.1 mmol/L   Chloride 107 98 - 111 mmol/L   CO2 24 22 - 32 mmol/L   Glucose, Bld 117 (H) 70 - 99 mg/dL   BUN 14 6 - 20 mg/dL   Creatinine, Ser 0.55 0.44 - 1.00 mg/dL   Calcium 9.3 8.9 - 10.3 mg/dL   Total Protein 7.6 6.5 - 8.1 g/dL   Albumin 4.2 3.5 - 5.0 g/dL   AST 28 15 - 41 U/L   ALT 31 0 - 44 U/L   Alkaline Phosphatase 61 38 - 126 U/L   Total Bilirubin 0.6 0.3 - 1.2 mg/dL   GFR, Estimated >60 >60 mL/min   Anion gap 8 5 - 15   Imaging Studies: CT Renal Stone Study  Result Date: 01/01/2021 CLINICAL DATA:  Right flank pain, kidney stone suspected. EXAM: CT ABDOMEN AND PELVIS WITHOUT CONTRAST TECHNIQUE: Multidetector CT imaging of the abdomen and pelvis was performed following the standard protocol  without IV contrast. COMPARISON:  09/15/2012. FINDINGS: Lower chest: No acute abnormality. Hepatobiliary: No focal liver abnormality is seen. Status post cholecystectomy. No biliary dilatation. Pancreas: Unremarkable. No pancreatic ductal dilatation or surrounding inflammatory changes. Spleen: Normal in size without focal abnormality. Adrenals/Urinary Tract: No  adrenal nodule or mass. No renal or ureteral calculus or obstructive uropathy bilaterally. The bladder is unremarkable. Stomach/Bowel: No bowel obstruction, free air or pneumatosis. Multiple scattered diverticula are present along the colon without evidence of diverticulitis. A normal appendix is present in the right lower quadrant. No focal bowel wall thickening is seen. Vascular/Lymphatic: Aortic atherosclerosis. There is a nonspecific prominent lymph node in the retroperitoneum to the right of the aorta measuring 9 mm in short axis diameter. Reproductive: Uterus and bilateral adnexa are unremarkable. Other: No free fluid in the pelvis. Musculoskeletal: Mild degenerative changes in the thoracolumbar spine. No acute osseous abnormality IMPRESSION: No evidence of renal or ureteral calculus or obstructive uropathy bilaterally. Colonic diverticulosis. Electronically Signed   By: Thornell Sartorius M.D.   On: 01/01/2021 02:51    ED COURSE and MDM  Nursing notes, initial and subsequent vitals signs, including pulse oximetry, reviewed and interpreted by myself.  Vitals:   01/01/21 0120  BP: 136/84  Pulse: 76  Resp: 16  Temp: 97.6 F (36.4 C)  TempSrc: Oral  SpO2: 96%   Medications  naproxen (NAPROSYN) tablet 500 mg (has no administration in time range)    Patient advised of CT scan showing no stones in the urinary tract.  It is possible she recently passed a stone as her symptomatology has improved.  PROCEDURES  Procedures   ED DIAGNOSES     ICD-10-CM   1. Right flank pain  R10.9          Katonya Blecher, Jonny Ruiz, MD 01/01/21 [id number]

## 2021-01-01 NOTE — ED Triage Notes (Signed)
Pt reports with right sided flank pain for several days. Pt has a hx of kidney stones and states that she has been feeling like they have been passing.

## 2021-01-07 ENCOUNTER — Other Ambulatory Visit: Payer: Self-pay | Admitting: Primary Care

## 2021-01-07 DIAGNOSIS — F411 Generalized anxiety disorder: Secondary | ICD-10-CM

## 2021-01-07 DIAGNOSIS — E1140 Type 2 diabetes mellitus with diabetic neuropathy, unspecified: Secondary | ICD-10-CM

## 2021-01-08 NOTE — Telephone Encounter (Signed)
Left message to return call to our office.  

## 2021-01-08 NOTE — Telephone Encounter (Signed)
Will you clarify how often she's taking gabapentin? Is she taking 1 capsule every other day?

## 2021-01-12 NOTE — Telephone Encounter (Signed)
Patient is taking 2 cab before she goes to bed in the morning.

## 2021-01-12 NOTE — Telephone Encounter (Signed)
Left message to return call to our office.  

## 2021-02-18 ENCOUNTER — Other Ambulatory Visit: Payer: Self-pay | Admitting: Primary Care

## 2021-02-18 DIAGNOSIS — E119 Type 2 diabetes mellitus without complications: Secondary | ICD-10-CM

## 2021-02-18 NOTE — Telephone Encounter (Signed)
Called pt to get scheduled. Lvmtcb to get scheduled.

## 2021-02-18 NOTE — Telephone Encounter (Signed)
Overdue for diabetes follow up. Please schedule.

## 2021-02-24 NOTE — Telephone Encounter (Signed)
Called pt to schedule OV. Lvmtcb

## 2021-03-06 ENCOUNTER — Encounter: Payer: Self-pay | Admitting: Primary Care

## 2021-03-06 ENCOUNTER — Ambulatory Visit (INDEPENDENT_AMBULATORY_CARE_PROVIDER_SITE_OTHER): Payer: BC Managed Care – PPO | Admitting: Primary Care

## 2021-03-06 ENCOUNTER — Other Ambulatory Visit: Payer: Self-pay

## 2021-03-06 VITALS — BP 124/82 | HR 92 | Temp 98.6°F | Ht 62.0 in | Wt 197.0 lb

## 2021-03-06 DIAGNOSIS — E1165 Type 2 diabetes mellitus with hyperglycemia: Secondary | ICD-10-CM

## 2021-03-06 LAB — POCT GLYCOSYLATED HEMOGLOBIN (HGB A1C): Hemoglobin A1C: 6.3 % — AB (ref 4.0–5.6)

## 2021-03-06 NOTE — Assessment & Plan Note (Signed)
Improved and under great control with A1C today of 6.3!!  Continue metformin 500 mg BID.  Foot and eye exams UTD. Declines statin therapy, will address again at upcoming CPE, consider Zetia 10 mg.   Declines pneumonia vaccine today.  Follow up in June 2023.

## 2021-03-06 NOTE — Patient Instructions (Addendum)
Think about the pneumonia vaccine.  Work on [employer], start exercising regularly.  See you in June!

## 2021-03-06 NOTE — Progress Notes (Signed)
Subjective:    Patient ID: Kayla Robles, female    DOB: [birth year], 51 y.o.   MRN: [id number]  HPI  Kayla Robles is a very pleasant 51 y.o. female with a history of hypertension, type 2 diabetes, GAD, chronic foot pain who presents today for follow up of diabetes.  Current medications include: Metformin 500 mg BID.   She is checking her blood glucose 0 times daily.  Last A1C: 6.6 in May 2022 Last Eye Exam: UTD Last Foot Exam: UTD Pneumonia Vaccination: Never completed. Declines today.  Urine Microalbumin: None. Managed on ACE-I Statin: None, intolerant to pravastatin and atorvastatin.   Dietary changes since last visit: None   Exercise: Swimming at [facility] [employer]   BP Readings from Last 3 Encounters:  03/06/21 124/82  01/01/21 118/70  08/12/20 124/80   Wt Readings from Last 3 Encounters:  03/06/21 197 lb (89.4 kg)  08/12/20 198 lb (89.8 kg)  06/18/20 204 lb (92.5 kg)       Review of Systems  Constitutional:  Positive for fatigue.  Eyes:  Negative for visual disturbance.  Respiratory:  Negative for shortness of breath.   Cardiovascular:  Negative for chest pain.  Neurological:  Positive for numbness. Negative for dizziness.        Past Medical History:  Diagnosis Date   Anxiety    Hypertension    Kidney stones    S/P cholecystectomy t   S/P tubal ligation 1992    Social History   Socioeconomic History   Marital status: Married    Spouse name: Not on file   Number of children: Not on file   Years of education: Not on file   Highest education level: Not on file  Occupational History   Not on file  Tobacco Use   Smoking status: Never   Smokeless tobacco: Never  Substance and Sexual Activity   Alcohol use: No   Drug use: No   Sexual activity: Not on file  Other Topics Concern   Not on file  Social History Narrative   Married.   Works as a Electrical engineer.   Enjoys playing with her grandchildren.   Social Determinants of Health    Financial Resource Strain: Not on file  Food Insecurity: Not on file  Transportation Needs: Not on file  Physical Activity: Not on file  Stress: Not on file  Social Connections: Not on file  Intimate Partner Violence: Not on file    Past Surgical History:  Procedure Laterality Date   CHOLECYSTECTOMY     LITHOTRIPSY      Family History  Problem Relation Age of Onset   Arthritis Mother    Heart disease Mother    Hypertension Mother    Hypertension Father    Diabetes Father     Allergies  Allergen Reactions   Levaquin [Levofloxacin] Anaphylaxis    "felt weird" felt worse than usually felt   Zithromax [Azithromycin] Anaphylaxis   Other Other (See Comments)    Most anxiety medications   Paroxetine Hcl Other (See Comments)    suicidal    Current Outpatient Medications on File Prior to Visit  Medication Sig Dispense Refill   gabapentin (NEURONTIN) 100 MG capsule Take 1 capsule (100 mg total) by mouth daily as needed. For pain. 90 capsule 0   hydrOXYzine (ATARAX) 25 MG tablet Take 1 tablet (25 mg total) by mouth at bedtime as needed for anxiety (and sleep). 90 tablet 1   lisinopril (ZESTRIL) 20 MG tablet  TAKE 1 TABLET DAILY FOR BLOOD PRESSURE 90 tablet 2   metFORMIN (GLUCOPHAGE) 500 MG tablet TAKE 1 TABLET TWICE A DAY WITH MEALS FOR DIABETES. Office visit required for further refills. 180 tablet 0   venlafaxine XR (EFFEXOR-XR) 150 MG 24 hr capsule Take 1 capsule (150 mg total) by mouth daily with breakfast. For anxiety 90 capsule 3   No current facility-administered medications on file prior to visit.    BP 124/82    Pulse 92    Temp 98.6 F (37 C) (Temporal)    Ht 5\' 2"  (1.575 m)    Wt 197 lb (89.4 kg)    LMP 03/12/2016    SpO2 97%    BMI 36.03 kg/m  Objective:   Physical Exam Cardiovascular:     Rate and Rhythm: Normal rate and regular rhythm.  Pulmonary:     Effort: Pulmonary effort is normal.     Breath sounds: Normal breath sounds.  Musculoskeletal:      Cervical back: Neck supple.  Skin:    General: Skin is warm and dry.  Psychiatric:        Mood and Affect: Mood normal.          Assessment & Plan:      This visit occurred during the SARS-CoV-2 public health emergency.  Safety protocols were in place, including screening questions prior to the visit, additional usage of staff PPE, and extensive cleaning of exam room while observing appropriate contact time as indicated for disinfecting solutions.

## 2021-04-25 ENCOUNTER — Other Ambulatory Visit: Payer: Self-pay | Admitting: Primary Care

## 2021-04-25 DIAGNOSIS — E1140 Type 2 diabetes mellitus with diabetic neuropathy, unspecified: Secondary | ICD-10-CM

## 2021-05-19 ENCOUNTER — Other Ambulatory Visit: Payer: Self-pay | Admitting: Primary Care

## 2021-05-19 DIAGNOSIS — E119 Type 2 diabetes mellitus without complications: Secondary | ICD-10-CM

## 2021-05-28 DIAGNOSIS — J069 Acute upper respiratory infection, unspecified: Secondary | ICD-10-CM | POA: Diagnosis not present

## 2021-05-28 DIAGNOSIS — R059 Cough, unspecified: Secondary | ICD-10-CM | POA: Diagnosis not present

## 2021-05-28 DIAGNOSIS — Z20822 Contact with and (suspected) exposure to covid-19: Secondary | ICD-10-CM | POA: Diagnosis not present

## 2021-06-26 ENCOUNTER — Encounter: Payer: Self-pay | Admitting: Primary Care

## 2021-06-26 ENCOUNTER — Ambulatory Visit (INDEPENDENT_AMBULATORY_CARE_PROVIDER_SITE_OTHER): Payer: BC Managed Care – PPO | Admitting: Primary Care

## 2021-06-26 VITALS — BP 124/78 | HR 78 | Temp 98.9°F | Ht 62.0 in | Wt 201.0 lb

## 2021-06-26 DIAGNOSIS — M79673 Pain in unspecified foot: Secondary | ICD-10-CM

## 2021-06-26 DIAGNOSIS — Z0000 Encounter for general adult medical examination without abnormal findings: Secondary | ICD-10-CM

## 2021-06-26 DIAGNOSIS — Z114 Encounter for screening for human immunodeficiency virus [HIV]: Secondary | ICD-10-CM

## 2021-06-26 DIAGNOSIS — E1165 Type 2 diabetes mellitus with hyperglycemia: Secondary | ICD-10-CM | POA: Diagnosis not present

## 2021-06-26 DIAGNOSIS — G8929 Other chronic pain: Secondary | ICD-10-CM

## 2021-06-26 DIAGNOSIS — F411 Generalized anxiety disorder: Secondary | ICD-10-CM

## 2021-06-26 DIAGNOSIS — I10 Essential (primary) hypertension: Secondary | ICD-10-CM | POA: Diagnosis not present

## 2021-06-26 DIAGNOSIS — Z789 Other specified health status: Secondary | ICD-10-CM

## 2021-06-26 DIAGNOSIS — Z1159 Encounter for screening for other viral diseases: Secondary | ICD-10-CM

## 2021-06-26 DIAGNOSIS — Z1231 Encounter for screening mammogram for malignant neoplasm of breast: Secondary | ICD-10-CM

## 2021-06-26 LAB — COMPREHENSIVE METABOLIC PANEL
ALT: 26 U/L (ref 0–35)
AST: 21 U/L (ref 0–37)
Albumin: 4.3 g/dL (ref 3.5–5.2)
Alkaline Phosphatase: 66 U/L (ref 39–117)
BUN: 16 mg/dL (ref 6–23)
CO2: 29 mEq/L (ref 19–32)
Calcium: 9.8 mg/dL (ref 8.4–10.5)
Chloride: 104 mEq/L (ref 96–112)
Creatinine, Ser: 0.74 mg/dL (ref 0.40–1.20)
GFR: 92.83 mL/min (ref >60.00)
Glucose, Bld: 103 mg/dL — ABNORMAL HIGH (ref 70–99)
Potassium: 4.3 mEq/L (ref 3.5–5.1)
Sodium: 142 mEq/L (ref 135–145)
Total Bilirubin: 0.5 mg/dL (ref 0.2–1.2)
Total Protein: 7.5 g/dL (ref 6.0–8.3)

## 2021-06-26 LAB — LIPID PANEL
Cholesterol: 211 mg/dL — ABNORMAL HIGH (ref 0–200)
HDL: 35.9 mg/dL — ABNORMAL LOW (ref >39.00)
NonHDL: 175.11
Total CHOL/HDL Ratio: 6
Triglycerides: 281 mg/dL — ABNORMAL HIGH (ref 0.0–149.0)
VLDL: 56.2 mg/dL — ABNORMAL HIGH (ref 0.0–40.0)

## 2021-06-26 LAB — LDL CHOLESTEROL, DIRECT: Direct LDL: 124 mg/dL

## 2021-06-26 LAB — HEMOGLOBIN A1C: Hgb A1c MFr Bld: 6.4 % (ref 4.6–6.5)

## 2021-06-26 NOTE — Progress Notes (Signed)
Subjective:    Patient ID: Kayla Robles, female    DOB: July 02, 1970, 52 y.o.   MRN: [id number]  HPI  Kayla Robles is a very pleasant 51 y.o. female who presents today for complete physical and follow up of chronic conditions.  Immunizations: -Tetanus: 2019 -Influenza: Did not complete last season  -Covid-19: 3 vaccines  -Shingles: Never completed, declines  -Pneumonia: Never completed, declines   Diet: Fair diet.  Exercise: Active, no regular exercise.  Eye exam: Completes annually  Dental exam: Completes semi-annually   Pap Smear: Completed in 2020, due, declines  Mammogram: Completed years ago, overdue  Colonoscopy: Never completed, overdue, declines   BP Readings from Last 3 Encounters:  06/26/21 124/78  03/06/21 124/82  01/01/21 118/70         Review of Systems  Constitutional:  Negative for unexpected weight change.  HENT:  Negative for rhinorrhea.   Eyes:  Negative for visual disturbance.  Respiratory:  Negative for cough and shortness of breath.   Cardiovascular:  Negative for chest pain.  Gastrointestinal:  Negative for constipation and diarrhea.  Genitourinary:  Negative for difficulty urinating.  Musculoskeletal:  Positive for arthralgias.  Skin:  Negative for rash.  Allergic/Immunologic: Negative for environmental allergies.  Neurological:  Negative for dizziness and headaches.  Psychiatric/Behavioral:  The patient is hyperactive.         Past Medical History:  Diagnosis Date   Anxiety    Hypertension    Kidney stones    S/P cholecystectomy t   S/P tubal ligation 1992    Social History   Socioeconomic History   Marital status: Married    Spouse name: Not on file   Number of children: Not on file   Years of education: Not on file   Highest education level: Not on file  Occupational History   Not on file  Tobacco Use   Smoking status: Never   Smokeless tobacco: Never  Substance and Sexual Activity   Alcohol use: No    Drug use: No   Sexual activity: Not on file  Other Topics Concern   Not on file  Social History Narrative   Married.   Works as a Air traffic controller.   Enjoys playing with her grandchildren.   Social Determinants of Health   Financial Resource Strain: Not on file  Food Insecurity: Not on file  Transportation Needs: Not on file  Physical Activity: Not on file  Stress: Not on file  Social Connections: Not on file  Intimate Partner Violence: Not on file    Past Surgical History:  Procedure Laterality Date   CHOLECYSTECTOMY     LITHOTRIPSY      Family History  Problem Relation Age of Onset   Arthritis Mother    Heart disease Mother    Hypertension Mother    Hypertension Father    Diabetes Father     Allergies  Allergen Reactions   Levaquin [Levofloxacin] Anaphylaxis    "felt weird" felt worse than usually felt   Zithromax [Azithromycin] Anaphylaxis   Other Other (See Comments)    Most anxiety medications   Paroxetine Hcl Other (See Comments)    suicidal    Current Outpatient Medications on File Prior to Visit  Medication Sig Dispense Refill   gabapentin (NEURONTIN) 100 MG capsule Take 1 capsule (100 mg total) by mouth daily as needed. For pain. Office visit required for further refills. 90 capsule 0   hydrOXYzine (ATARAX) 25 MG tablet Take 1 tablet (25  mg total) by mouth at bedtime as needed for anxiety (and sleep). 90 tablet 1   lisinopril (ZESTRIL) 20 MG tablet TAKE 1 TABLET DAILY FOR BLOOD PRESSURE 90 tablet 2   metFORMIN (GLUCOPHAGE) 500 MG tablet TAKE 1 TABLET TWICE A DAY WITH MEALS FOR DIABETES 180 tablet 0   venlafaxine XR (EFFEXOR-XR) 150 MG 24 hr capsule Take 1 capsule (150 mg total) by mouth daily with breakfast. For anxiety 90 capsule 3   No current facility-administered medications on file prior to visit.    BP 124/78   Pulse 78   Temp 98.9 F (37.2 C) (Oral)   Ht 5\' 2"  (1.575 m)   Wt 201 lb (91.2 kg)   LMP 03/12/2016   SpO2 98%   BMI 36.76 kg/m   Objective:   Physical Exam HENT:     Right Ear: Tympanic membrane and ear canal normal.     Left Ear: Tympanic membrane and ear canal normal.     Nose: Nose normal.  Eyes:     Conjunctiva/sclera: Conjunctivae normal.     Pupils: Pupils are equal, round, and reactive to light.  Neck:     Thyroid: No thyromegaly.  Cardiovascular:     Rate and Rhythm: Normal rate and regular rhythm.     Heart sounds: No murmur heard. Pulmonary:     Effort: Pulmonary effort is normal.     Breath sounds: Normal breath sounds. No rales.  Abdominal:     General: Bowel sounds are normal.     Palpations: Abdomen is soft.     Tenderness: There is no abdominal tenderness.  Musculoskeletal:        General: Normal range of motion.     Cervical back: Neck supple.  Lymphadenopathy:     Cervical: No cervical adenopathy.  Skin:    General: Skin is warm and dry.     Findings: No rash.  Neurological:     Mental Status: She is alert and oriented to person, place, and time.     Cranial Nerves: No cranial nerve deficit.     Deep Tendon Reflexes: Reflexes are normal and symmetric.  Psychiatric:        Mood and Affect: Mood normal.          Assessment & Plan:

## 2021-06-26 NOTE — Assessment & Plan Note (Signed)
Failed multiple statin medications. Repeat lipid panel pending.

## 2021-06-26 NOTE — Assessment & Plan Note (Signed)
Stable.  Continue gabapentin 100 mg HS.

## 2021-06-26 NOTE — Assessment & Plan Note (Signed)
Repeat A1C pending.  Continue metformin 500 mg BID. Managed on ACE-I. Declines pneumonia vaccine.  Failed statin therapy.   Foot and eye exam UTD.  Follow up in 6 months

## 2021-06-26 NOTE — Assessment & Plan Note (Signed)
Declines Shingrix and pneumonia vaccines despite recommendations. Pap smear due, she declines despite recommendations.  Mammogram overdue, she agrees to proceed. Orders placed. Colonoscopy overdue, she declines despite recommendations.   Discussed the importance of a healthy diet and regular exercise in order for weight loss, and to reduce the risk of further co-morbidity.  Exam today stable Labs pending.

## 2021-06-26 NOTE — Assessment & Plan Note (Signed)
Controlled.  Continue lisinopril 20 mg daily.  CMP pending. 

## 2021-06-26 NOTE — Patient Instructions (Signed)
Stop by the lab prior to leaving today. I will notify you of your results once received.   [facility] to schedule your mamm[facility].   Please think about the colonoscopy, pap smear, Shingles/Pneumonia vaccines.  Please schedule a follow up visit for 6 months for a diabetes check.  It was a pleasure to see you today!  Preventive Care 53-53 Years Old, Female Preventive care refers to lifestyle choices and visits with your health care provider that can promote health and wellness. Preventive care visits are also called wellness exams. What can I expect for my preventive care visit? Counseling Your health care provider may ask you questions about your: Medical history, including: Past medical problems. Family medical history. Pregnancy history. Current health, including: Menstrual cycle. Method of birth control. Emotional well-being. Home life and relationship well-being. Sexual activity and sexual health. Lifestyle, including: Alcohol, nicotine or tobacco, and drug use. Access to firearms. Diet, exercise, and sleep habits. Work and work Statistician. Sunscreen use. Safety issues such as seatbelt and bike helmet use. Physical exam Your health care provider will check your: Height and weight. These may be used to calculate your BMI (body mass index). BMI is a measurement that tells if you are at a healthy weight. Waist circumference. This measures the distance around your waistline. This measurement also tells if you are at a healthy weight and may help predict your risk of certain diseases, such as type 2 diabetes and high blood pressure. Heart rate and blood pressure. Body temperature. Skin for abnormal spots. What immunizations do I need?  Vaccines are usually given at various ages, according to a schedule. Your health care provider will recommend vaccines for you based on your age, medical history, and lifestyle or other factors, such as travel or where you work. What  tests do I need? Screening Your health care provider may recommend screening tests for certain conditions. This may include: Lipid and cholesterol levels. Diabetes screening. This is done by checking your blood sugar (glucose) after you have not eaten for a while (fasting). Pelvic exam and Pap test. Hepatitis B test. Hepatitis C test. HIV (human immunodeficiency virus) test. STI (sexually transmitted infection) testing, if you are at risk. Lung cancer screening. Colorectal cancer screening. Mammogram. Talk with your health care provider about when you should start having regular mammograms. This may depend on whether you have a family history of breast cancer. BRCA-related cancer screening. This may be done if you have a family history of breast, ovarian, tubal, or peritoneal cancers. Bone density scan. This is done to screen for osteoporosis. Talk with your health care provider about your test results, treatment options, and if necessary, the need for more tests. Follow these instructions at home: Eating and drinking  Eat a diet that includes fresh fruits and vegetables, whole grains, lean protein, and low-fat dairy products. Take vitamin and mineral supplements as recommended by your health care provider. Do not drink alcohol if: Your health care provider tells you not to drink. You are pregnant, may be pregnant, or are planning to become pregnant. If you drink alcohol: Limit how much you have to 0-1 drink a day. Know how much alcohol is in your drink. In the [place] one drink equals one 12 oz bottle of beer (355 mL), one 5 oz glass of wine (148 mL), or one 1 oz glass of hard liquor (44 mL). Lifestyle Brush your teeth every morning and night with fluoride toothpaste. Floss one time each day. Exercise for at  least 30 minutes 5 or more days each week. Do not use any products that contain nicotine or tobacco. These products include cigarettes, chewing tobacco, and vaping devices, such as  e-cigarettes. If you need help quitting, ask your health care provider. Do not use drugs. If you are sexually active, practice safe sex. Use a condom or other form of protection to prevent STIs. If you do not wish to become pregnant, use a form of birth control. If you plan to become pregnant, see your health care provider for a prepregnancy visit. Take aspirin only as told by your health care provider. Make sure that you understand how much to take and what form to take. Work with your health care provider to find out whether it is safe and beneficial for you to take aspirin daily. Find healthy ways to manage stress, such as: Meditation, yoga, or listening to music. Journaling. Talking to a trusted person. Spending time with friends and family. Minimize exposure to UV radiation to reduce your risk of skin cancer. Safety Always wear your seat belt while driving or riding in a vehicle. Do not drive: If you have been drinking alcohol. Do not ride with someone who has been drinking. When you are tired or distracted. While texting. If you have been using any mind-altering substances or drugs. Wear a helmet and other protective equipment during sports activities. If you have firearms in your house, make sure you follow all gun safety procedures. Seek help if you have been physically or sexually abused. What's next? Visit your health care provider once a year for an annual wellness visit. Ask your health care provider how often you should have your eyes and teeth checked. Stay up to date on all vaccines. This information is not intended to replace advice given to you by your health care provider. Make sure you discuss any questions you have with your health care provider. Document Revised: 07/09/2020 Document Reviewed: 07/09/2020 Elsevier Patient Education  [place].

## 2021-06-26 NOTE — Assessment & Plan Note (Signed)
Overall controlled.  Continue venlafaxine ER 150 mg daily. Continue hydroxyzine 25 mg PRN.

## 2021-06-29 LAB — HEPATITIS C ANTIBODY
Hepatitis C Ab: NONREACTIVE
SIGNAL TO CUT-OFF: 0.08 (ref <1.00)

## 2021-06-29 LAB — HIV ANTIBODY (ROUTINE TESTING W REFLEX): HIV 1&2 Ab, 4th Generation: NONREACTIVE

## 2021-07-03 ENCOUNTER — Encounter: Payer: BC Managed Care – PPO | Admitting: Primary Care

## 2021-07-06 ENCOUNTER — Other Ambulatory Visit: Payer: Self-pay | Admitting: Primary Care

## 2021-07-06 DIAGNOSIS — I10 Essential (primary) hypertension: Secondary | ICD-10-CM

## 2021-07-09 ENCOUNTER — Other Ambulatory Visit: Payer: Self-pay | Admitting: Primary Care

## 2021-07-09 DIAGNOSIS — E1140 Type 2 diabetes mellitus with diabetic neuropathy, unspecified: Secondary | ICD-10-CM

## 2021-07-13 ENCOUNTER — Other Ambulatory Visit: Payer: Self-pay | Admitting: Primary Care

## 2021-07-13 DIAGNOSIS — F411 Generalized anxiety disorder: Secondary | ICD-10-CM

## 2021-07-16 NOTE — Telephone Encounter (Signed)
Do you need f/u to fill out fmla? She was in for cpe 06/26/20

## 2021-07-31 NOTE — Telephone Encounter (Signed)
Have either of you seen FMLA paperwork on this patient?

## 2021-08-13 NOTE — Telephone Encounter (Signed)
Pt called in to make PCP aware that FMLA paperwork was fax to office today 08/13/21 and would like to be notified when you receive it

## 2021-08-14 ENCOUNTER — Telehealth: Payer: Self-pay

## 2021-08-14 DIAGNOSIS — Z0279 Encounter for issue of other medical certificate: Secondary | ICD-10-CM

## 2021-08-14 NOTE — Telephone Encounter (Signed)
FMLA form faxed to F# [phone].  Received fax confirmation.  Made a copy for myself, billing, scanning, and Ms Shrake.  Copy placed in front office for patient pickup.

## 2021-08-14 NOTE — Telephone Encounter (Signed)
Completed and placed on [name]'s desk 

## 2021-08-14 NOTE — Telephone Encounter (Signed)
Have you seen this ppw?  

## 2021-08-14 NOTE — Telephone Encounter (Signed)
We received FMLA for for Kayla Robles.  This is a continuation of the previous FMLA form from 05/2020.  Intermittent leave for anxiety, depression, panic attacks.  Dates requested:  08/14/2021-01/24/2022.  Flare ups - same frequency as before.  No planned appts at this time.  Kayla Robles did state that she might call to make a follow up appt with you soon re:  medication not working as expected.  Form placed in your inbox for signing and completion.  Due back by 08/26/2021.  Patient will pick up copy when complete, ok to send mychart note.

## 2021-08-17 ENCOUNTER — Other Ambulatory Visit: Payer: Self-pay | Admitting: Primary Care

## 2021-08-17 DIAGNOSIS — E119 Type 2 diabetes mellitus without complications: Secondary | ICD-10-CM

## 2021-09-23 ENCOUNTER — Other Ambulatory Visit: Payer: Self-pay | Admitting: Primary Care

## 2021-09-23 DIAGNOSIS — F411 Generalized anxiety disorder: Secondary | ICD-10-CM

## 2021-10-20 ENCOUNTER — Telehealth: Payer: BC Managed Care – PPO | Admitting: Primary Care

## 2021-10-27 DIAGNOSIS — J209 Acute bronchitis, unspecified: Secondary | ICD-10-CM | POA: Diagnosis not present

## 2021-11-16 ENCOUNTER — Telehealth: Payer: Self-pay | Admitting: Primary Care

## 2021-11-16 DIAGNOSIS — E119 Type 2 diabetes mellitus without complications: Secondary | ICD-10-CM

## 2021-11-16 NOTE — Telephone Encounter (Signed)
Patient is due for Diabetes follow up in December 2023, this will be required prior to any further refills.  Please schedule.

## 2021-11-17 NOTE — Telephone Encounter (Signed)
Lvmtcb, left message for pt on MyChart

## 2021-12-31 ENCOUNTER — Ambulatory Visit (INDEPENDENT_AMBULATORY_CARE_PROVIDER_SITE_OTHER): Payer: BC Managed Care – PPO | Admitting: Primary Care

## 2021-12-31 ENCOUNTER — Encounter: Payer: Self-pay | Admitting: Primary Care

## 2021-12-31 VITALS — BP 112/68 | HR 70 | Temp 97.0°F | Ht 62.0 in | Wt 205.0 lb

## 2021-12-31 DIAGNOSIS — E1165 Type 2 diabetes mellitus with hyperglycemia: Secondary | ICD-10-CM | POA: Diagnosis not present

## 2021-12-31 LAB — POCT GLYCOSYLATED HEMOGLOBIN (HGB A1C): Hemoglobin A1C: 6.7 % — AB (ref 4.0–5.6)

## 2021-12-31 LAB — MICROALBUMIN / CREATININE URINE RATIO
Creatinine,U: 127.3 mg/dL
Microalb Creat Ratio: 1.8 mg/g (ref 0.0–30.0)
Microalb, Ur: 2.3 mg/dL — ABNORMAL HIGH (ref 0.0–1.9)

## 2021-12-31 NOTE — Progress Notes (Signed)
Subjective:    Patient ID: [name], female    DOB: 1970/03/10, 51 y.o.   MRN: [id number]  HPI  Stephanne R Kunkler is a very pleasant 51 y.o. female with a history of type 2 diabetes, hypertension, GAD who presents today for follow up of diabetes.  Current medications include: Metformin 500 mg BID  She is checking her blood glucose 0 times daily.  Last A1C: 6.4 in June 2023. 6.7 today Last Eye Exam: UTD - will get records Last Foot Exam: Due Pneumonia Vaccination: Declines Urine Microalbumin: Due Statin: Intolerant to multiple statins  Dietary changes since last visit: She has increased consumption of juice over the last several months.    Exercise: No regular exercise       Review of Systems  Respiratory:  Negative for shortness of breath.   Cardiovascular:  Negative for chest pain.  Neurological:  Negative for dizziness and numbness.         Past Medical History:  Diagnosis Date   Anxiety    Hypertension    Kidney stones    S/P cholecystectomy t   S/P tubal ligation 1992    Social History   Socioeconomic History   Marital status: Married    Spouse name: Not on file   Number of children: Not on file   Years of education: Not on file   Highest education level: Not on file  Occupational History   Not on file  Tobacco Use   Smoking status: Never   Smokeless tobacco: Never  Substance and Sexual Activity   Alcohol use: No   Drug use: No   Sexual activity: Not on file  Other Topics Concern   Not on file  Social History Narrative   Married.   Works as a Air traffic controller.   Enjoys playing with her grandchildren.   Social Determinants of Health   Financial Resource Strain: Not on file  Food Insecurity: Not on file  Transportation Needs: Not on file  Physical Activity: Not on file  Stress: Not on file  Social Connections: Not on file  Intimate Partner Violence: Not on file    Past Surgical History:  Procedure Laterality Date    CHOLECYSTECTOMY     LITHOTRIPSY      Family History  Problem Relation Age of Onset   Arthritis Mother    Heart disease Mother    Hypertension Mother    Hypertension Father    Diabetes Father     Allergies  Allergen Reactions   Levaquin [Levofloxacin] Anaphylaxis    "felt weird" felt worse than usually felt   Zithromax [Azithromycin] Anaphylaxis   Other Other (See Comments)    Most anxiety medications   Paroxetine Hcl Other (See Comments)    suicidal    Current Outpatient Medications on File Prior to Visit  Medication Sig Dispense Refill   gabapentin (NEURONTIN) 100 MG capsule Take 1 capsule (100 mg total) by mouth at bedtime. For pain. 90 capsule 3   hydrOXYzine (ATARAX) 25 MG tablet TAKE 1 TABLET AT BEDTIME AS NEEDED FOR ANXIETY AND SLEEP 90 tablet 1   lisinopril (ZESTRIL) 20 MG tablet TAKE 1 TABLET DAILY FOR BLOOD PRESSURE 90 tablet 3   metFORMIN (GLUCOPHAGE) 500 MG tablet TAKE 1 TABLET TWICE A DAY WITH MEALS FOR DIABETES. Office visit required for further refills. 180 tablet 0   venlafaxine XR (EFFEXOR-XR) 150 MG 24 hr capsule TAKE 1 CAPSULE DAILY WITH BREAKFAST FOR ANXIETY 90 capsule 2   No  current facility-administered medications on file prior to visit.    BP 112/68   Pulse 70   Temp (!) 97 F (36.1 C) (Temporal)   Ht 5\' 2"  (1.575 m)   Wt 205 lb (93 kg)   LMP 03/12/2016   SpO2 96%   BMI 37.49 kg/m  Objective:   Physical Exam Cardiovascular:     Rate and Rhythm: Normal rate and regular rhythm.  Pulmonary:     Effort: Pulmonary effort is normal.     Breath sounds: Normal breath sounds.  Musculoskeletal:     Cervical back: Neck supple.  Skin:    General: Skin is warm and dry.           Assessment & Plan:   Problem List Items Addressed This Visit       Endocrine   Type 2 diabetes mellitus with hyperglycemia ([place]) - Primary    Increased with A1C of 6.7 but overall still under good control. Discussed to limit drinking juices.   Continue  metformin 500 mg BID. Foot exam today. Urine microalbumin due and pending.  Declines pneumovax.  Follow up in 6 months.       Relevant Orders   POCT glycosylated hemoglobin (Hb A1C) (Completed)   Microalbumin/Creatinine Ratio, Urine       Pleas Koch, NP

## 2021-12-31 NOTE — Patient Instructions (Signed)
Reduce intake of juice.  Continue metformin as discussed.  Please schedule a physical to meet with me in 6 months.   It was a pleasure to see you today!

## 2021-12-31 NOTE — Assessment & Plan Note (Signed)
Increased with A1C of 6.7 but overall still under good control. Discussed to limit drinking juices.   Continue metformin 500 mg BID. Foot exam today. Urine microalbumin due and pending.  Declines pneumovax.  Follow up in 6 months.

## 2022-01-05 ENCOUNTER — Telehealth: Payer: Self-pay | Admitting: Primary Care

## 2022-01-05 DIAGNOSIS — Z20822 Contact with and (suspected) exposure to covid-19: Secondary | ICD-10-CM | POA: Diagnosis not present

## 2022-01-05 DIAGNOSIS — U071 COVID-19: Secondary | ICD-10-CM | POA: Diagnosis not present

## 2022-01-05 NOTE — Telephone Encounter (Signed)
Patient called back in to follow up on this. Thank you!

## 2022-01-05 NOTE — Telephone Encounter (Signed)
She tested positive today and started experiencing symptoms yesterday.

## 2022-01-05 NOTE — Telephone Encounter (Signed)
Noted  

## 2022-01-05 NOTE — Telephone Encounter (Signed)
Patient called in and stated she took two home test and was positive for Covid.  She has been experiencing scratchy throat, diarrhea, nausea, headache, fever, cough, and chills. She stated that her job will not accept home test and she needs a test from her PCP. Please advise. Thank you!

## 2022-01-05 NOTE — Telephone Encounter (Signed)
Please call patient:  If symptoms began within the last 5 days then she may benefit from Paxlovid and would therefore need an office visit.  If she is not interested in antiviral treatment, and is only requesting a test, then a nurse visit is fine for testing.

## 2022-01-05 NOTE — Telephone Encounter (Signed)
Pt's husband called asking for update on calls from earlier. I told husband pt's response, he said he'd take the pt to a walk in clinic.

## 2022-03-03 ENCOUNTER — Other Ambulatory Visit: Payer: Self-pay | Admitting: Primary Care

## 2022-03-03 DIAGNOSIS — F411 Generalized anxiety disorder: Secondary | ICD-10-CM

## 2022-03-18 NOTE — Telephone Encounter (Signed)
Please schedule patient for an office visit.

## 2022-03-19 ENCOUNTER — Encounter: Payer: Self-pay | Admitting: Family Medicine

## 2022-03-19 ENCOUNTER — Ambulatory Visit: Payer: BC Managed Care – PPO | Admitting: Family Medicine

## 2022-03-19 VITALS — BP 120/84 | HR 91 | Temp 97.3°F | Ht 62.0 in | Wt 212.2 lb

## 2022-03-19 DIAGNOSIS — G6289 Other specified polyneuropathies: Secondary | ICD-10-CM | POA: Diagnosis not present

## 2022-03-19 DIAGNOSIS — R5383 Other fatigue: Secondary | ICD-10-CM | POA: Diagnosis not present

## 2022-03-19 DIAGNOSIS — G629 Polyneuropathy, unspecified: Secondary | ICD-10-CM | POA: Insufficient documentation

## 2022-03-19 DIAGNOSIS — F321 Major depressive disorder, single episode, moderate: Secondary | ICD-10-CM | POA: Diagnosis not present

## 2022-03-19 DIAGNOSIS — F411 Generalized anxiety disorder: Secondary | ICD-10-CM | POA: Diagnosis not present

## 2022-03-19 MED ORDER — GABAPENTIN 100 MG PO CAPS: 300.0000 mg | ORAL_CAPSULE | Freq: Every day | ORAL | 0 refills | Status: DC

## 2022-03-19 NOTE — Assessment & Plan Note (Signed)
Chronic, acute worsening  Symptoms should be significantly improved shortly given daughter moving out and stressful situation improved.  Continue venlafaxine 150 mg XR for now but consider change to Cymbalta and/or amitriptyline/imipramine as needed. Return and ER precautions provided.

## 2022-03-19 NOTE — Patient Instructions (Addendum)
Please stop at the lab to have labs drawn.  Work on stress reduction and relaxation techniques.  Consider family or personal counseling.  Increase gabapentin to 300 mg at bedtime.. if not better in 1-2 weeks and if no side effect.. increase 400 mg at bedtime.  Alpha-lipoic acid seems to delay or reverse peripheral diabetic neuropathy through its multiple antioxidant properties. Treatment with alpha-lipoic acid increases reduced glutathione, an important endogenous antioxidant. In clinical trials, 600 mg alpha-lipoic acid has been shown to improve neuropathic deficits.   If not improving consider change of venlafaxine to Cymbalta.

## 2022-03-19 NOTE — Addendum Note (Signed)
Addended by: Pilar Grammes on: 03/19/2022 03:25 PM   Modules accepted: Orders

## 2022-03-19 NOTE — Progress Notes (Signed)
Patient ID: Kayla Robles, female    DOB: 04-Dec-1970, 52 y.o.   MRN: [id number]  This visit was conducted in person.  BP 120/84   Pulse 91   Temp (!) 97.3 F (36.3 C) (Temporal)   Ht '5\' 2"'$  (1.575 m)   Wt 212 lb 4 oz (96.3 kg)   LMP 03/12/2016   SpO2 95%   BMI 38.82 kg/m    CC:  Chief Complaint  Patient presents with   Peripheral Neuropathy    Subjective:   HPI: Kayla Robles is a 52 y.o. female  patient of [facility] with history of type 2 DM  presenting on 03/19/2022 for Peripheral Neuropathy  She has past Dx of chronic foot pain...reviewed last OV note for this from 06/2021    Continue gabapentin 100 mg daily q HS   She has noted worsening pain in soles of feet.. burning or cold pain in feet, tingling/pins and needles, shooting pain in feet.  Legs achy at times as well.  No swelling.  Mainly issues at night.  She reports she has increased gabapentin to 200 mg at bestime 03/04/2022.  Pain is worse with stress.  Works up on [employer], on concrete floors, wears steel toed shoes.   She has been under more stress since the first of the year.  She feels very depressed and tearful... venlafaxine used to control symptoms.. no longer helping. Family issues.  Lab Results  Component Value Date   HGBA1C 6.7 (A) 12/31/2021    Relevant past medical, surgical, family and social history reviewed and updated as indicated. Interim medical history since our last visit reviewed. Allergies and medications reviewed and updated. Outpatient Medications Prior to Visit  Medication Sig Dispense Refill   hydrOXYzine (ATARAX) 25 MG tablet TAKE 1 TABLET AT BEDTIME AS NEEDED FOR ANXIETY AND SLEEP 90 tablet 0   lisinopril (ZESTRIL) 20 MG tablet TAKE 1 TABLET DAILY FOR BLOOD PRESSURE 90 tablet 3   metFORMIN (GLUCOPHAGE) 500 MG tablet TAKE 1 TABLET TWICE A DAY WITH MEALS FOR DIABETES. Office visit required for further refills. 180 tablet 0   venlafaxine XR (EFFEXOR-XR) 150 MG 24 hr capsule  TAKE 1 CAPSULE DAILY WITH BREAKFAST FOR ANXIETY 90 capsule 2   gabapentin (NEURONTIN) 100 MG capsule Take 1 capsule (100 mg total) by mouth at bedtime. For pain. (Patient taking differently: Take 200 mg by mouth at bedtime. For pain.) 90 capsule 3   gabapentin (NEURONTIN) 100 MG capsule Take 200 mg by mouth at bedtime.     No facility-administered medications prior to visit.     Per HPI unless specifically indicated in ROS section below Review of Systems  Constitutional:  Negative for fatigue and fever.  HENT:  Negative for congestion.   Eyes:  Negative for pain.  Respiratory:  Negative for cough and shortness of breath.   Cardiovascular:  Negative for chest pain, palpitations and leg swelling.  Gastrointestinal:  Negative for abdominal pain.  Genitourinary:  Negative for dysuria and vaginal bleeding.  Musculoskeletal:  Negative for back pain.  Neurological:  Negative for syncope, light-headedness and headaches.  Psychiatric/Behavioral:  Negative for dysphoric mood.    Objective:  BP 120/84   Pulse 91   Temp (!) 97.3 F (36.3 C) (Temporal)   Ht '5\' 2"'$  (1.575 m)   Wt 212 lb 4 oz (96.3 kg)   LMP 03/12/2016   SpO2 95%   BMI 38.82 kg/m   Wt Readings from Last 3 Encounters:  03/19/22  212 lb 4 oz (96.3 kg)  12/31/21 205 lb (93 kg)  06/26/21 201 lb (91.2 kg)      Physical Exam Constitutional:      General: She is not in acute distress.    Appearance: Normal appearance. She is well-developed. She is obese. She is not ill-appearing or toxic-appearing.  HENT:     Head: Normocephalic.     Right Ear: Hearing, tympanic membrane, ear canal and external ear normal. Tympanic membrane is not erythematous, retracted or bulging.     Left Ear: Hearing, tympanic membrane, ear canal and external ear normal. Tympanic membrane is not erythematous, retracted or bulging.     Nose: No mucosal edema or rhinorrhea.     Right Sinus: No maxillary sinus tenderness or frontal sinus tenderness.     Left  Sinus: No maxillary sinus tenderness or frontal sinus tenderness.     Mouth/Throat:     Pharynx: Uvula midline.  Eyes:     General: Lids are normal. Lids are everted, no foreign bodies appreciated.     Conjunctiva/sclera: Conjunctivae normal.     Pupils: Pupils are equal, round, and reactive to light.  Neck:     Thyroid: No thyroid mass or thyromegaly.     Vascular: No carotid bruit.     Trachea: Trachea normal.  Cardiovascular:     Rate and Rhythm: Normal rate and regular rhythm.     Pulses: Normal pulses.     Heart sounds: Normal heart sounds, S1 normal and S2 normal. No murmur heard.    No friction rub. No gallop.  Pulmonary:     Effort: Pulmonary effort is normal. No tachypnea or respiratory distress.     Breath sounds: Normal breath sounds. No decreased breath sounds, wheezing, rhonchi or rales.  Abdominal:     General: Bowel sounds are normal.     Palpations: Abdomen is soft.     Tenderness: There is no abdominal tenderness.  Musculoskeletal:     Cervical back: Normal range of motion and neck supple.  Skin:    General: Skin is warm and dry.     Findings: No rash.  Neurological:     Mental Status: She is alert.     Cranial Nerves: Cranial nerves 2-12 are intact. No cranial nerve deficit.     Sensory: Sensory deficit present.     Motor: Motor function is intact.     Coordination: Coordination is intact.     Comments: Decresed monofilament bialteral feet  Psychiatric:        Mood and Affect: Mood is not anxious or depressed.        Speech: Speech normal.        Behavior: Behavior normal. Behavior is cooperative.        Thought Content: Thought content normal.        Judgment: Judgment normal.       Results for orders placed or performed in visit on 12/31/21  Microalbumin/Creatinine Ratio, Urine  Result Value Ref Range   Microalb, Ur 2.3 (H) 0.0 - 1.9 mg/dL   Creatinine,U 127.3 mg/dL   Microalb Creat Ratio 1.8 0.0 - 30.0 mg/g  POCT glycosylated hemoglobin (Hb A1C)   Result Value Ref Range   Hemoglobin A1C 6.7 (A) 4.0 - 5.6 %   HbA1c POC (<> result, manual entry)     HbA1c, POC (prediabetic range)     HbA1c, POC (controlled diabetic range)      Assessment and Plan  Other polyneuropathy Assessment & Plan:  Chronic, worsened with recent worsening of mood.  Will eval weight thyroid and B12 for secondary causes, but most likely cause is chronic diabetes. Per patient diabetes recently in good control with most recent A1c at goal.  Will increase gabapentin to 300 mg p.o. nightly, increase further if no side effects to 400 mg if pain not controlled.  She will also start ALA 600 mg daily.  If not improving she can return to consider improved treatment of mood with possible change to Cymbalta, versus addition of imipramine at bedtime.  Orders: -     Vitamin B12 -     TSH  Current moderate episode of major depressive disorder without prior episode [facility]) Assessment & Plan: Chronic, acute worsening  Symptoms should be significantly improved shortly given daughter moving out and stressful situation improved.  Continue venlafaxine 150 mg XR for now but consider change to Cymbalta and/or amitriptyline/imipramine as needed. Return and ER precautions provided.   Generalized anxiety disorder  Other orders -     Gabapentin; Take 3 capsules (300 mg total) by mouth at bedtime.  Dispense: 270 capsule; Refill: 0    No follow-ups on file.   Eliezer Lofts, MD

## 2022-03-19 NOTE — Assessment & Plan Note (Signed)
Chronic, worsened with recent worsening of mood.  Will eval weight thyroid and B12 for secondary causes, but most likely cause is chronic diabetes. Per patient diabetes recently in good control with most recent A1c at goal.  Will increase gabapentin to 300 mg p.o. nightly, increase further if no side effects to 400 mg if pain not controlled.  She will also start ALA 600 mg daily.  If not improving she can return to consider improved treatment of mood with possible change to Cymbalta, versus addition of imipramine at bedtime.

## 2022-03-20 LAB — TSH: TSH: 2.15 mIU/L

## 2022-03-20 LAB — VITAMIN B12: Vitamin B-12: 315 pg/mL (ref 200–1100)

## 2022-03-22 ENCOUNTER — Telehealth: Payer: Self-pay

## 2022-03-22 NOTE — Telephone Encounter (Signed)
We received patient's FMLA form.  This has been placed in your inbox for signing.  She last saw Dr Diona Browner on 03/19/22, but you are her PCP.  This should be filled out the same as her previous form from 07/2021.  Dates of intermittent leave:  backdate to 03/19/2022-02/26/2023.  She had to leave early on 03/19/22 for her appt with Dr Diona Browner, and she got a point for leaving one hour early.  Due date:  04/07/22.

## 2022-03-24 NOTE — Telephone Encounter (Signed)
Completed form faxed to fax# [phone].  Received fax confirmation.  Copies made for patient, scanning, and myself.  Sent msg via mychart to see how she would like to receive her copy.

## 2022-03-24 NOTE — Telephone Encounter (Signed)
Completed and placed on [name]'s desk. 

## 2022-04-29 ENCOUNTER — Encounter: Payer: Self-pay | Admitting: Family Medicine

## 2022-04-29 ENCOUNTER — Ambulatory Visit: Payer: BC Managed Care – PPO | Admitting: Family Medicine

## 2022-04-29 VITALS — BP 130/70 | HR 87 | Temp 98.0°F | Ht 62.0 in | Wt 213.2 lb

## 2022-04-29 DIAGNOSIS — E559 Vitamin D deficiency, unspecified: Secondary | ICD-10-CM | POA: Diagnosis not present

## 2022-04-29 DIAGNOSIS — R5383 Other fatigue: Secondary | ICD-10-CM

## 2022-04-29 DIAGNOSIS — H9202 Otalgia, left ear: Secondary | ICD-10-CM | POA: Diagnosis not present

## 2022-04-29 DIAGNOSIS — R42 Dizziness and giddiness: Secondary | ICD-10-CM | POA: Diagnosis not present

## 2022-04-29 DIAGNOSIS — R519 Headache, unspecified: Secondary | ICD-10-CM | POA: Diagnosis not present

## 2022-04-29 DIAGNOSIS — R079 Chest pain, unspecified: Secondary | ICD-10-CM

## 2022-04-29 DIAGNOSIS — H8112 Benign paroxysmal vertigo, left ear: Secondary | ICD-10-CM | POA: Diagnosis not present

## 2022-04-29 DIAGNOSIS — J309 Allergic rhinitis, unspecified: Secondary | ICD-10-CM | POA: Diagnosis not present

## 2022-04-29 NOTE — Patient Instructions (Signed)
Try some Meclizine (dramamine) for the dizziness

## 2022-04-29 NOTE — Progress Notes (Signed)
Randon Somera T. Maryalice Pasley, MD, CAQ Sports Medicine [facility] at [facility] [address] [place], [address]  Phone: [phone]  FAX: [phone]  Kayla Robles - 52 y.o. female  MRN [id number]  Date of Birth: 1970/02/11  Date: 04/29/2022  PCP: Doreene Nest, NP  Referral: Doreene Nest, NP  Chief Complaint  Patient presents with   Headache    Seen at [facility] this morning-Still concerned    Dizziness   Ear Pain    Left   Subjective:   Kayla Robles is a 52 y.o. very pleasant female patient with Body mass index is 39 kg/m. who presents with the following:  This morning, but cae with her in February.  Family living with her for much of this last year.  [place]Orking on the weekend.   Has a lot of anxiety and stress.  Taking care of her child and grandchildren.  Feels really tired, and has chalked it up to age.  Daughter just got her own place.  Helped her move.  This week is calm.   Jumped up this morning, and she work up on the floor.  Ran to the bathroom, and thought maybe her BP.  Feels dizzy all day long. Periauricular pain.   About three weeks ago, she had some chest pain.   Headache on and off.  Has not really been anxious.  Heart fluttering.    Fatigued, sweating a lot.  Sometimes at night.  She is having some true vertigo.   Not orthostatic No major EKG changes  Review of Systems is noted in the HPI, as appropriate  Objective:   BP 130/70   Pulse 87   Temp 98 F (36.7 C) (Temporal)   Ht 5\' 2"  (1.575 m)   Wt 213 lb 4 oz (96.7 kg)   LMP 03/12/2016   SpO2 97%   BMI 39.00 kg/m   She was not orthostatic on exam, unfortunately it looks as if this was not entered into the medical record.   GEN: No acute distress; alert,appropriate. PULM: Breathing comfortably in no respiratory distress PSYCH: Normally interactive.  CV: RRR, no m/g/r  PULM: Normal respiratory rate, no accessory muscle use. No  wheezes, crackles or rhonchi  ENT: TMs are clear bilaterally.  Nontender around the tragus or the remainder of the ear.  No lymphadenopathy in the neck.  She does have mild inducible vertigo with rotational maneuvers of the head as well as bending between her legs and standing up.  Laboratory and Imaging Data:  Assessment and Plan:     ICD-10-CM   1. Dizziness  R42 Basic metabolic panel    CBC with Differential/Platelet    Hepatic function panel    Vitamin B12    VITAMIN D 25 Hydroxy (Vit-D Deficiency, Fractures)    2. Chest pain, unspecified type  R07.9 EKG 12-Lead    3. Nonintractable headache, unspecified chronicity pattern, unspecified headache type  R51.9     4. Other fatigue  R53.83 Basic metabolic panel    CBC with Differential/Platelet    Hepatic function panel    Vitamin B12    VITAMIN D 25 Hydroxy (Vit-D Deficiency, Fractures)    5. Vitamin D deficiency  E55.9 VITAMIN D 25 Hydroxy (Vit-D Deficiency, Fractures)     Dizziness does seem to be positional vertigo, but relatively mild.  I reviewed BPV with the patient.  Of note, urgent care felt like her symptoms were predominantly BPV.  EKG:  Normal sinus rhythm. Normal axis, normal R wave progression, No acute ST elevation or depression.   EKG is normal and reassuring.  Intermittent headache, generalized fatigue, and globally not feeling all that well.  Etiology is not entirely clear.  She has been also under a lot of stress at home.  We will do a basic laboratory workup. Globally, stable appearing.  Medication Management during today's office visit: No orders of the defined types were placed in this encounter.  There are no discontinued medications.  Orders placed today for conditions managed today: Orders Placed This Encounter  Procedures   Basic metabolic panel   CBC with Differential/Platelet   Hepatic function panel   Vitamin B12   VITAMIN D 25 Hydroxy (Vit-D Deficiency, Fractures)   EKG 12-Lead     Disposition: No follow-ups on file.  [employer] One speech-to-text software was used for transcription in this dictation.  Possible transcriptional errors can occur using Animal nutritionist.   Signed,  Elpidio Galea. Woodley Petzold, MD   Outpatient Encounter Medications as of 04/29/2022  Medication Sig   gabapentin (NEURONTIN) 100 MG capsule Take 3 capsules (300 mg total) by mouth at bedtime.   hydrOXYzine (ATARAX) 25 MG tablet TAKE 1 TABLET AT BEDTIME AS NEEDED FOR ANXIETY AND SLEEP   lisinopril (ZESTRIL) 20 MG tablet TAKE 1 TABLET DAILY FOR BLOOD PRESSURE   metFORMIN (GLUCOPHAGE) 500 MG tablet TAKE 1 TABLET TWICE A DAY WITH MEALS FOR DIABETES. Office visit required for further refills.   venlafaxine XR (EFFEXOR-XR) 150 MG 24 hr capsule TAKE 1 CAPSULE DAILY WITH BREAKFAST FOR ANXIETY   No facility-administered encounter medications on file as of 04/29/2022.

## 2022-04-30 LAB — CBC WITH DIFFERENTIAL/PLATELET
Basophils Absolute: 0 10*3/uL (ref 0.0–0.1)
Basophils Relative: 0.5 % (ref 0.0–3.0)
Eosinophils Absolute: 0.2 10*3/uL (ref 0.0–0.7)
Eosinophils Relative: 3.2 % (ref 0.0–5.0)
HCT: 38 % (ref 36.0–46.0)
Hemoglobin: 13.1 g/dL (ref 12.0–15.0)
Lymphocytes Relative: 27.6 % (ref 12.0–46.0)
Lymphs Abs: 2.1 10*3/uL (ref 0.7–4.0)
MCHC: 34.4 g/dL (ref 30.0–36.0)
MCV: 88.7 fl (ref 78.0–100.0)
Monocytes Absolute: 0.5 10*3/uL (ref 0.1–1.0)
Monocytes Relative: 6.4 % (ref 3.0–12.0)
Neutro Abs: 4.8 10*3/uL (ref 1.4–7.7)
Neutrophils Relative %: 62.3 % (ref 43.0–77.0)
Platelets: 276 10*3/uL (ref 150.0–400.0)
RBC: 4.29 Mil/uL (ref 3.87–5.11)
RDW: 12.5 % (ref 11.5–15.5)
WBC: 7.7 10*3/uL (ref 4.0–10.5)

## 2022-04-30 LAB — BASIC METABOLIC PANEL
BUN: 10 mg/dL (ref 6–23)
CO2: 30 mEq/L (ref 19–32)
Calcium: 9.8 mg/dL (ref 8.4–10.5)
Chloride: 102 mEq/L (ref 96–112)
Creatinine, Ser: 0.67 mg/dL (ref 0.40–1.20)
GFR: 99.69 mL/min (ref >60.00)
Glucose, Bld: 193 mg/dL — ABNORMAL HIGH (ref 70–99)
Potassium: 4 mEq/L (ref 3.5–5.1)
Sodium: 140 mEq/L (ref 135–145)

## 2022-04-30 LAB — HEPATIC FUNCTION PANEL
ALT: 31 U/L (ref 0–35)
AST: 25 U/L (ref 0–37)
Albumin: 4.3 g/dL (ref 3.5–5.2)
Alkaline Phosphatase: 73 U/L (ref 39–117)
Bilirubin, Direct: 0.1 mg/dL (ref 0.0–0.3)
Total Bilirubin: 0.3 mg/dL (ref 0.2–1.2)
Total Protein: 7 g/dL (ref 6.0–8.3)

## 2022-04-30 LAB — VITAMIN D 25 HYDROXY (VIT D DEFICIENCY, FRACTURES): VITD: 12.19 ng/mL — ABNORMAL LOW (ref 30.00–100.00)

## 2022-04-30 LAB — VITAMIN B12: Vitamin B-12: 282 pg/mL (ref 211–911)

## 2022-05-03 MED ORDER — VITAMIN D (ERGOCALCIFEROL) 1.25 MG (50000 UNIT) PO CAPS: 50000.0000 [IU] | ORAL_CAPSULE | ORAL | 2 refills | Status: DC

## 2022-05-03 NOTE — Addendum Note (Signed)
Addended by: Hannah Beat on: 05/03/2022 04:37 PM   Modules accepted: Orders

## 2022-05-06 IMAGING — CT CT RENAL STONE PROTOCOL
2 of 4 series · 16 of 46 positions shown, 18 images · non-contrast
Comparison: 09/15/2012.

CLINICAL DATA: Right flank pain, kidney stone suspected.

EXAM:
CT ABDOMEN AND PELVIS WITHOUT CONTRAST
TECHNIQUE: Multidetector CT imaging of the abdomen and pelvis was performed
following the standard protocol without IV contrast.

[Series 2: axial st · axial · 0.70mm/px · z∈[-716,-340]mm · 13 of 85 slices shown, 15 images]
[im 5/85  soft-tissue]
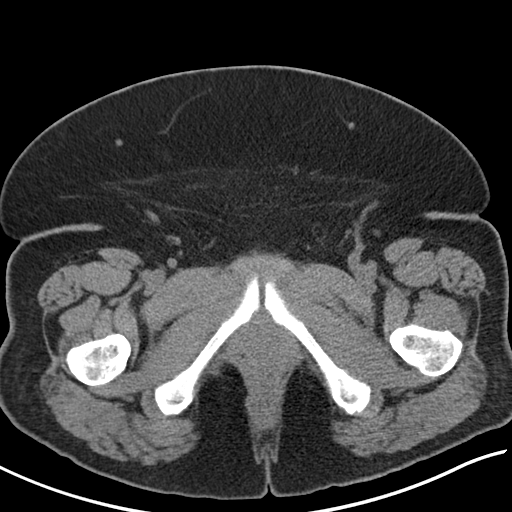
[im 5/85  bone]
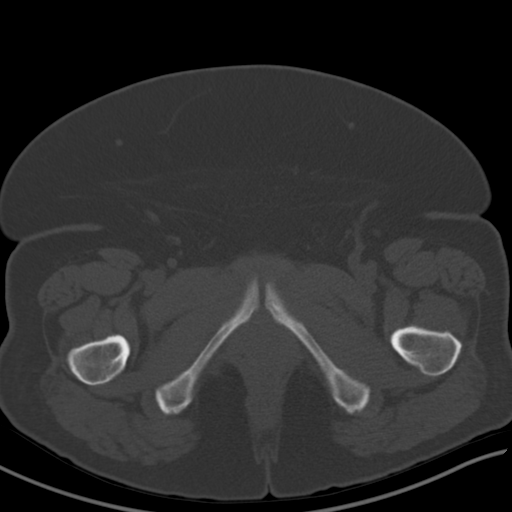
[im 13/85  soft-tissue]
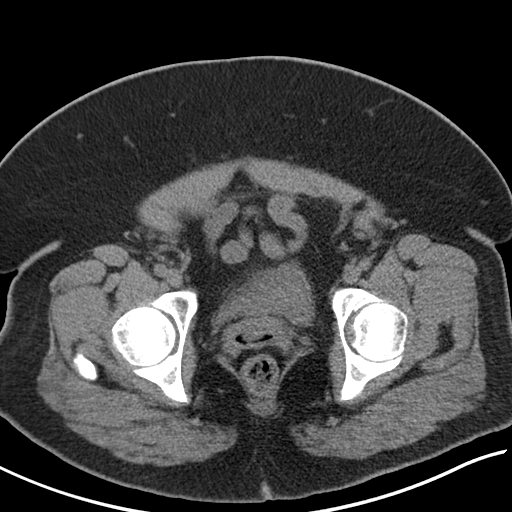
[im 17/85  soft-tissue]
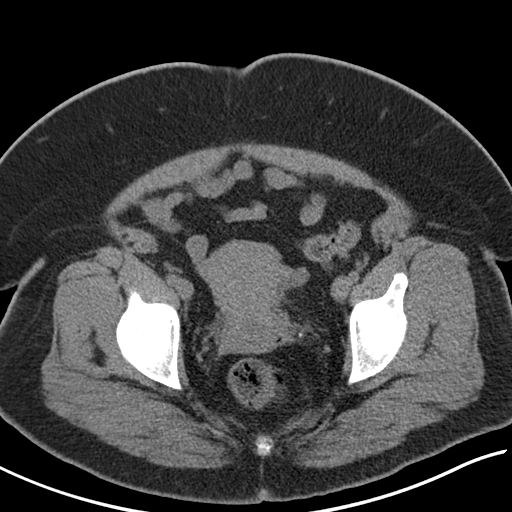
[im 26/85  soft-tissue]
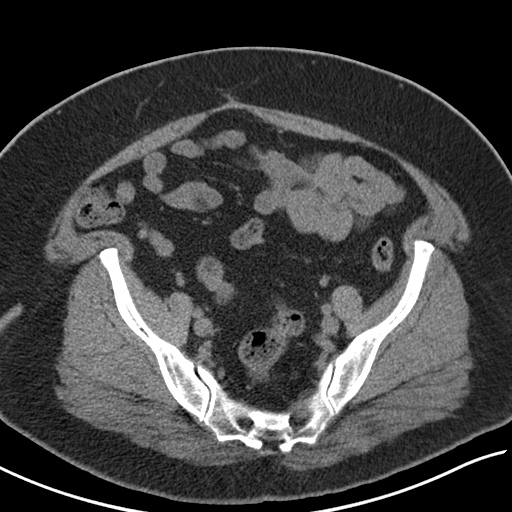
[im 30/85  soft-tissue]
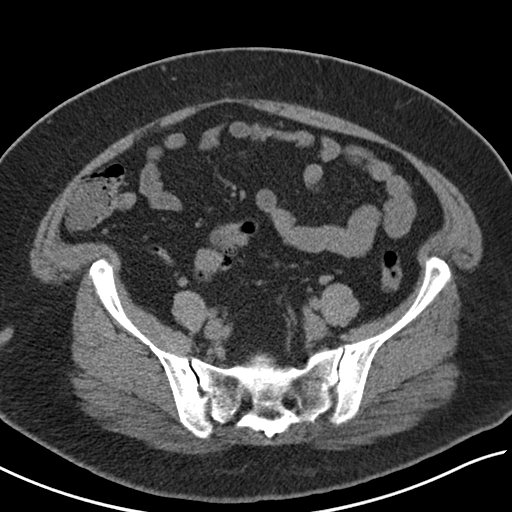
[im 38/85  soft-tissue]
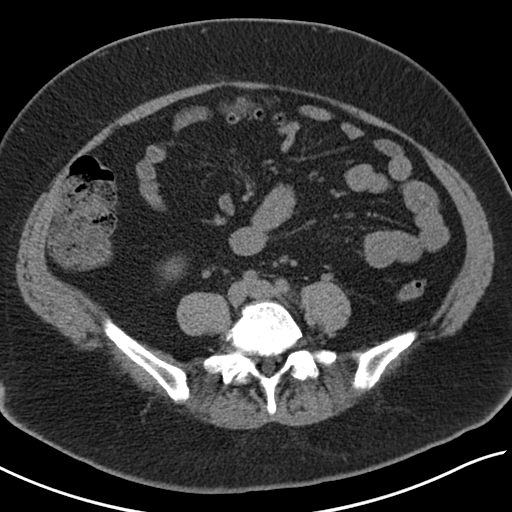
[im 43/85  soft-tissue]
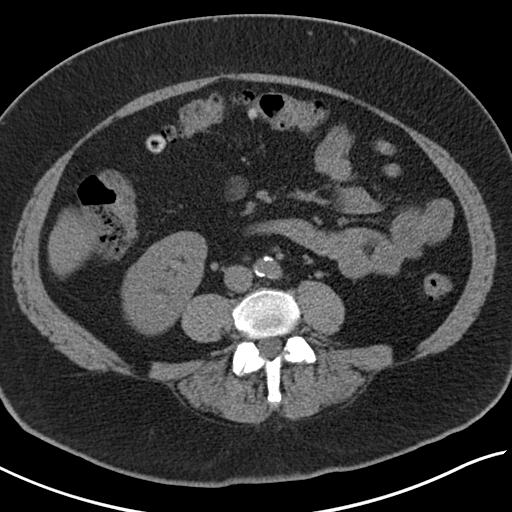
[im 47/85  soft-tissue]
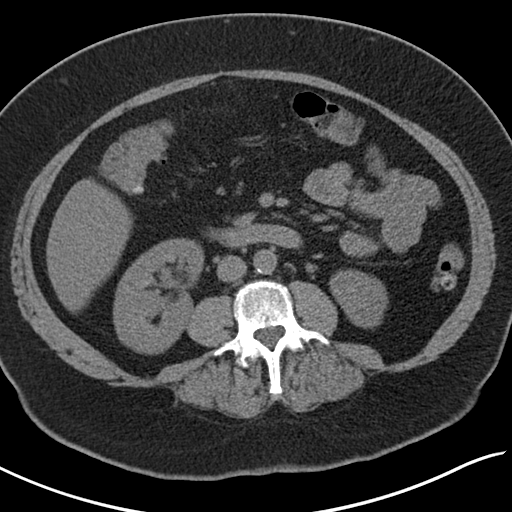
[im 55/85  soft-tissue]
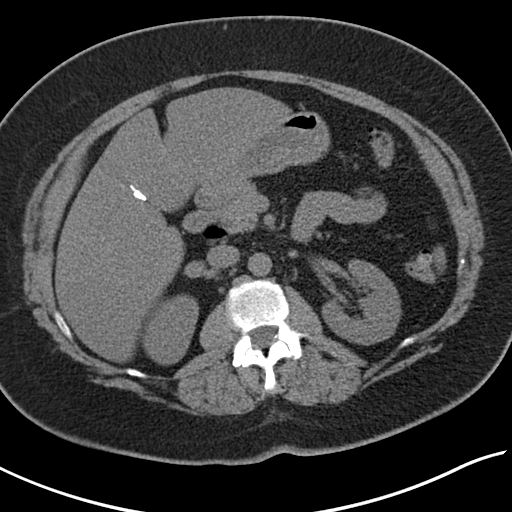
[im 55/85  bone]
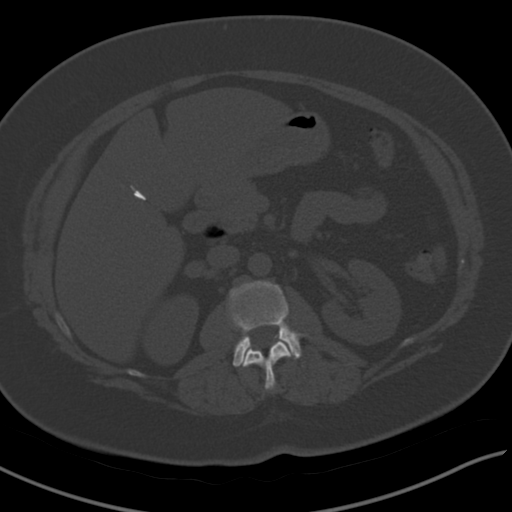
[im 59/85  soft-tissue]
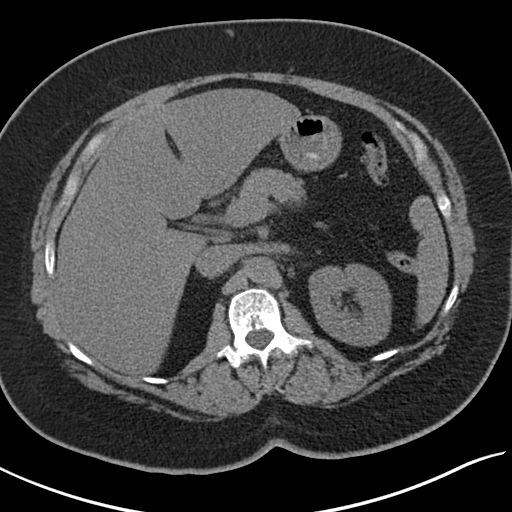
[im 68/85  soft-tissue]
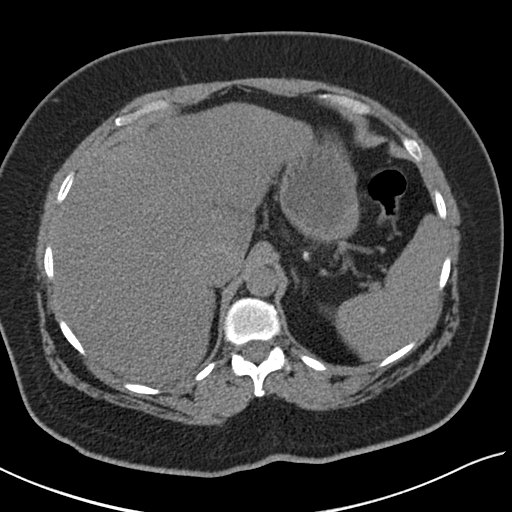
[im 72/85  soft-tissue]
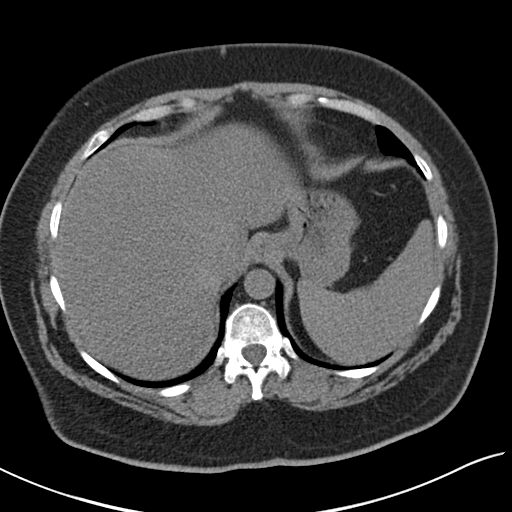
[im 80/85  soft-tissue]
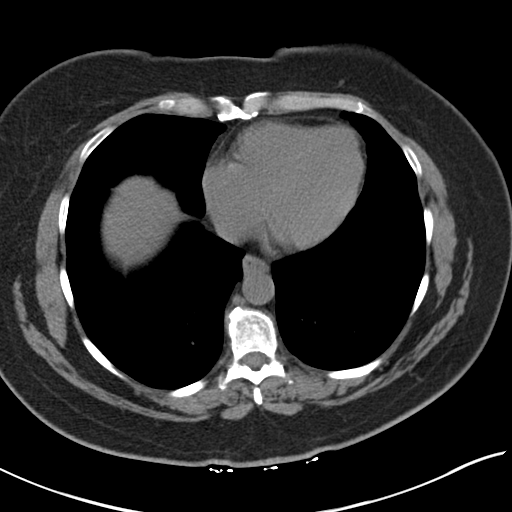

[Series 4: coronal · coronal · 0.71mm/px · 3 of 164 slices shown]
[im 55/164  soft-tissue]
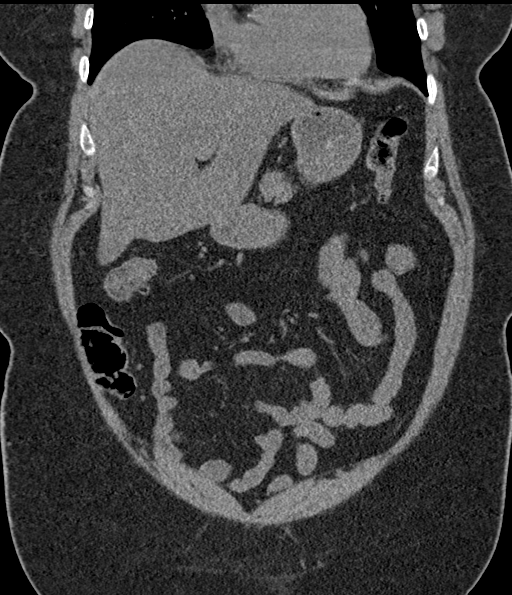
[im 73/164  soft-tissue]
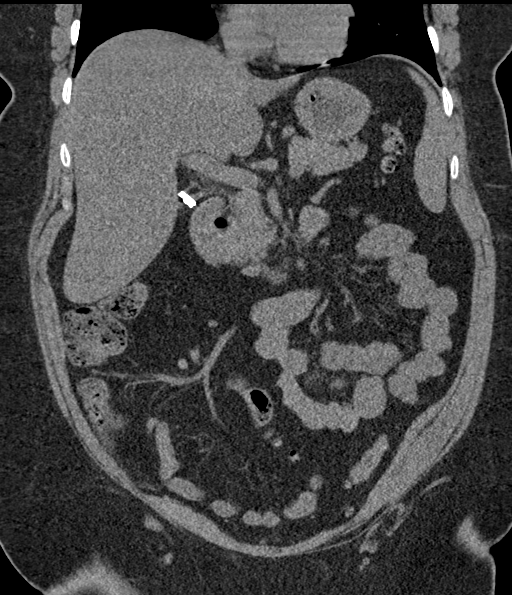
[im 91/164  soft-tissue]
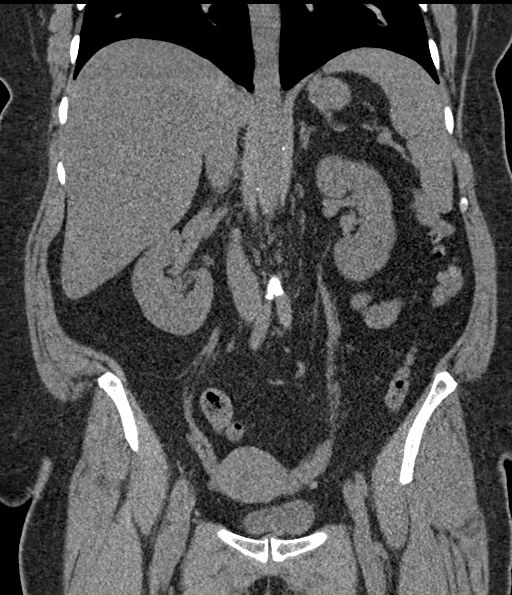

[16 of 46 positions shown; findings below may reference images not displayed]

FINDINGS: Lower chest: No acute abnormality.

Hepatobiliary: No focal liver abnormality is seen. Status post
cholecystectomy. No biliary dilatation.

Pancreas: Unremarkable. No pancreatic ductal dilatation or
surrounding inflammatory changes.

Spleen: Normal in size without focal abnormality.

Adrenals/Urinary Tract: No adrenal nodule or mass. No renal or
ureteral calculus or obstructive uropathy bilaterally. The bladder
is unremarkable.

Stomach/Bowel: No bowel obstruction, free air or pneumatosis.
Multiple scattered diverticula are present along the colon without
evidence of diverticulitis. A normal appendix is present in the
right lower quadrant. No focal bowel wall thickening is seen.

Vascular/Lymphatic: Aortic atherosclerosis. There is a nonspecific
prominent lymph node in the retroperitoneum to the right of the
aorta measuring 9 mm in short axis diameter.

Reproductive: Uterus and bilateral adnexa are unremarkable.

Other: No free fluid in the pelvis.

Musculoskeletal: Mild degenerative changes in the thoracolumbar
spine. No acute osseous abnormality
IMPRESSION: No evidence of renal or ureteral calculus or obstructive uropathy
bilaterally.

Colonic diverticulosis.

## 2022-05-31 DIAGNOSIS — J019 Acute sinusitis, unspecified: Secondary | ICD-10-CM | POA: Diagnosis not present

## 2022-05-31 DIAGNOSIS — R059 Cough, unspecified: Secondary | ICD-10-CM | POA: Diagnosis not present

## 2022-05-31 DIAGNOSIS — J40 Bronchitis, not specified as acute or chronic: Secondary | ICD-10-CM | POA: Diagnosis not present

## 2022-05-31 DIAGNOSIS — R051 Acute cough: Secondary | ICD-10-CM | POA: Diagnosis not present

## 2022-05-31 DIAGNOSIS — J029 Acute pharyngitis, unspecified: Secondary | ICD-10-CM | POA: Diagnosis not present

## 2022-06-05 DIAGNOSIS — F411 Generalized anxiety disorder: Secondary | ICD-10-CM

## 2022-06-07 MED ORDER — VENLAFAXINE HCL ER 150 MG PO CP24: ORAL_CAPSULE | ORAL | 0 refills | Status: DC

## 2022-07-01 ENCOUNTER — Encounter: Payer: BC Managed Care – PPO | Admitting: Primary Care

## 2022-07-01 ENCOUNTER — Other Ambulatory Visit: Payer: Self-pay | Admitting: Primary Care

## 2022-07-01 DIAGNOSIS — I10 Essential (primary) hypertension: Secondary | ICD-10-CM

## 2022-07-02 ENCOUNTER — Ambulatory Visit (INDEPENDENT_AMBULATORY_CARE_PROVIDER_SITE_OTHER): Payer: BC Managed Care – PPO | Admitting: Primary Care

## 2022-07-02 ENCOUNTER — Other Ambulatory Visit: Payer: Self-pay | Admitting: Primary Care

## 2022-07-02 ENCOUNTER — Encounter: Payer: Self-pay | Admitting: Primary Care

## 2022-07-02 VITALS — BP 130/72 | HR 90 | Temp 97.0°F | Ht 62.0 in | Wt 214.0 lb

## 2022-07-02 DIAGNOSIS — I10 Essential (primary) hypertension: Secondary | ICD-10-CM | POA: Diagnosis not present

## 2022-07-02 DIAGNOSIS — E559 Vitamin D deficiency, unspecified: Secondary | ICD-10-CM

## 2022-07-02 DIAGNOSIS — F321 Major depressive disorder, single episode, moderate: Secondary | ICD-10-CM

## 2022-07-02 DIAGNOSIS — Z1231 Encounter for screening mammogram for malignant neoplasm of breast: Secondary | ICD-10-CM

## 2022-07-02 DIAGNOSIS — Z0000 Encounter for general adult medical examination without abnormal findings: Secondary | ICD-10-CM

## 2022-07-02 DIAGNOSIS — Z1211 Encounter for screening for malignant neoplasm of colon: Secondary | ICD-10-CM

## 2022-07-02 DIAGNOSIS — Z7984 Long term (current) use of oral hypoglycemic drugs: Secondary | ICD-10-CM

## 2022-07-02 DIAGNOSIS — F411 Generalized anxiety disorder: Secondary | ICD-10-CM

## 2022-07-02 DIAGNOSIS — E119 Type 2 diabetes mellitus without complications: Secondary | ICD-10-CM

## 2022-07-02 DIAGNOSIS — G6289 Other specified polyneuropathies: Secondary | ICD-10-CM | POA: Diagnosis not present

## 2022-07-02 DIAGNOSIS — E1165 Type 2 diabetes mellitus with hyperglycemia: Secondary | ICD-10-CM

## 2022-07-02 DIAGNOSIS — E785 Hyperlipidemia, unspecified: Secondary | ICD-10-CM | POA: Diagnosis not present

## 2022-07-02 DIAGNOSIS — Z789 Other specified health status: Secondary | ICD-10-CM

## 2022-07-02 LAB — COMPREHENSIVE METABOLIC PANEL
ALT: 41 U/L — ABNORMAL HIGH (ref 0–35)
AST: 35 U/L (ref 0–37)
Albumin: 4.4 g/dL (ref 3.5–5.2)
Alkaline Phosphatase: 65 U/L (ref 39–117)
BUN: 13 mg/dL (ref 6–23)
CO2: 27 mEq/L (ref 19–32)
Calcium: 9.5 mg/dL (ref 8.4–10.5)
Chloride: 100 mEq/L (ref 96–112)
Creatinine, Ser: 0.63 mg/dL (ref 0.40–1.20)
GFR: 101.06 mL/min (ref >60.00)
Glucose, Bld: 184 mg/dL — ABNORMAL HIGH (ref 70–99)
Potassium: 4.3 mEq/L (ref 3.5–5.1)
Sodium: 139 mEq/L (ref 135–145)
Total Bilirubin: 0.5 mg/dL (ref 0.2–1.2)
Total Protein: 7.3 g/dL (ref 6.0–8.3)

## 2022-07-02 LAB — VITAMIN D 25 HYDROXY (VIT D DEFICIENCY, FRACTURES): VITD: 23.68 ng/mL — ABNORMAL LOW (ref 30.00–100.00)

## 2022-07-02 LAB — HEMOGLOBIN A1C: Hgb A1c MFr Bld: 7.9 % — ABNORMAL HIGH (ref 4.6–6.5)

## 2022-07-02 LAB — LIPID PANEL
Cholesterol: 219 mg/dL — ABNORMAL HIGH (ref 0–200)
HDL: 35.1 mg/dL — ABNORMAL LOW (ref >39.00)
Total CHOL/HDL Ratio: 6
Triglycerides: 449 mg/dL — ABNORMAL HIGH (ref 0.0–149.0)

## 2022-07-02 LAB — LDL CHOLESTEROL, DIRECT: Direct LDL: 121 mg/dL

## 2022-07-02 MED ORDER — VITAMIN D (ERGOCALCIFEROL) 1.25 MG (50000 UNIT) PO CAPS
50000.0000 [IU] | ORAL_CAPSULE | ORAL | 0 refills | Status: DC
Start: 2022-07-02 — End: 2022-10-06

## 2022-07-02 NOTE — Assessment & Plan Note (Signed)
Controlled.   Continue gabapentin 100 mg HS. She does not take 300 mg.

## 2022-07-02 NOTE — Assessment & Plan Note (Signed)
Improved.  Continue Effexor ER 150 mg daily. Consider switching to Zoloft or Lexapro for better anxiety control. She will update.

## 2022-07-02 NOTE — Assessment & Plan Note (Signed)
Repeat A1C pending.  Continue metformin 500 mg BID.  Follow up in 3-6 months based on A1C result.

## 2022-07-02 NOTE — Assessment & Plan Note (Signed)
Continue vitamin D 50,000 IU capsules weekly. Repeat vitamin D level pending. 

## 2022-07-02 NOTE — Assessment & Plan Note (Signed)
Repeat lipid panel pending.  She is at high risk for ASCVD given her history of diabetes and hypertension.  Statin intolerance to many medications. Consider Nexlizet versus Zetia alone.

## 2022-07-02 NOTE — Assessment & Plan Note (Signed)
Uncontrolled  Referral placed for therapy.  Continue venlafaxine ER 150 mg daily for now.  Consider switching to Zoloft versus Lexapro. Start buspirone 5 mg twice daily, instructions provided for starting.  She will update in a few weeks.

## 2022-07-02 NOTE — Assessment & Plan Note (Signed)
Declines pneumonia vaccine and Shingrix vaccines. Pap smear overdue, she continues to decline despite recommendations. Mammogram overdue, she agrees this year.  Orders placed for mobile mammogram unit Colonoscopy overdue, she agrees to complete Cologuard.  Orders placed.  Discussed the importance of a healthy diet and regular exercise in order for weight loss, and to reduce the risk of further co-morbidity.  Exam stable. Labs pending.  Follow up in 1 year for repeat physical.

## 2022-07-02 NOTE — Assessment & Plan Note (Signed)
Repeat lipid panel pending.  Consider Nexlizet versus Zetia.

## 2022-07-02 NOTE — Assessment & Plan Note (Signed)
Controlled.  Continue lisinopril 20 mg daily.  CMP pending. 

## 2022-07-02 NOTE — Progress Notes (Signed)
Subjective:    Patient ID: Kayla Robles, female    DOB: 19-Feb-1970, 52 y.o.   MRN: [id number]  HPI  [name] is a very pleasant 52 y.o. female who presents today for complete physical and follow up of chronic conditions.  She would like to discuss ongoing anxiety. Symptoms include irritability, very tired, difficulty concentrating, on edge often, mood swings. She feels that her depression has improved with taking vitamin D. She is managed on Effexor XR 150 mg daily and hydroxyzine 25 mg PRN.  She has been taking hydroxyzine every evening for anxiety, it's not helping.   She is interested in therapy. She does have a Rx for Buspirone at home, has not taken yet.   Immunizations: -Tetanus: Completed in 2019 -Influenza: Completed last season -Shingles: Never completed, declines -Pneumonia: Never completed, declines  Diet: Fair diet.  Exercise: No regular exercise. Active at work.   Eye exam: Completes annually  Dental exam: Completes semi-annually    Pap Smear: Completed in 2020, declined last year and declines this year. Mammogram: Completed years ago, declined last year, agrees now.  Colonoscopy: Never completed, declined last year.  BP Readings from Last 3 Encounters:  07/02/22 130/72  04/29/22 130/70  03/19/22 120/84    Wt Readings from Last 3 Encounters:  07/02/22 214 lb (97.1 kg)  04/29/22 213 lb 4 oz (96.7 kg)  03/19/22 212 lb 4 oz (96.3 kg)      Review of Systems  Constitutional:  Positive for fatigue. Negative for unexpected weight change.  HENT:  Negative for rhinorrhea.   Respiratory:  Negative for cough and shortness of breath.   Cardiovascular:  Negative for chest pain.  Gastrointestinal:  Negative for constipation and diarrhea.  Genitourinary:  Negative for difficulty urinating and menstrual problem.  Musculoskeletal:  Negative for arthralgias and myalgias.  Skin:  Negative for rash.  Allergic/Immunologic: Negative for environmental  allergies.  Neurological:  Negative for dizziness, numbness and headaches.  Psychiatric/Behavioral:  The patient is nervous/anxious.          Past Medical History:  Diagnosis Date   Anxiety    Hypertension    Kidney stones    S/P cholecystectomy t   S/P tubal ligation 1992    Social History   Socioeconomic History   Marital status: Married    Spouse name: Not on file   Number of children: Not on file   Years of education: Not on file   Highest education level: Not on file  Occupational History   Not on file  Tobacco Use   Smoking status: Never   Smokeless tobacco: Never  Substance and Sexual Activity   Alcohol use: No   Drug use: No   Sexual activity: Not on file  Other Topics Concern   Not on file  Social History Narrative   Married.   Works as a Air traffic controller.   Enjoys playing with her grandchildren.   Social Determinants of Health   Financial Resource Strain: Not on file  Food Insecurity: Not on file  Transportation Needs: Not on file  Physical Activity: Not on file  Stress: Not on file  Social Connections: Not on file  Intimate Partner Violence: Not on file    Past Surgical History:  Procedure Laterality Date   CHOLECYSTECTOMY     LITHOTRIPSY      Family History  Problem Relation Age of Onset   Arthritis Mother    Heart disease Mother    Hypertension Mother  Hypertension Father    Diabetes Father     Allergies  Allergen Reactions   Levaquin [Levofloxacin] Anaphylaxis    "felt weird" felt worse than usually felt   Zithromax [Azithromycin] Anaphylaxis   Other Other (See Comments)    Most anxiety medications   Paroxetine Hcl Other (See Comments)    suicidal    Current Outpatient Medications on File Prior to Visit  Medication Sig Dispense Refill   busPIRone (BUSPAR) 5 MG tablet Take 5 mg by mouth 2 (two) times daily.     gabapentin (NEURONTIN) 100 MG capsule Take 3 capsules (300 mg total) by mouth at bedtime. 270 capsule 0    hydrOXYzine (ATARAX) 25 MG tablet TAKE 1 TABLET AT BEDTIME AS NEEDED FOR ANXIETY AND SLEEP 90 tablet 0   lisinopril (ZESTRIL) 20 MG tablet TAKE 1 TABLET DAILY FOR BLOOD PRESSURE 90 tablet 3   metFORMIN (GLUCOPHAGE) 500 MG tablet TAKE 1 TABLET TWICE A DAY WITH MEALS FOR DIABETES. Office visit required for further refills. 180 tablet 0   venlafaxine XR (EFFEXOR-XR) 150 MG 24 hr capsule TAKE 1 CAPSULE DAILY WITH BREAKFAST FOR ANXIETY 90 capsule 0   Vitamin D, Ergocalciferol, (DRISDOL) 1.25 MG (50000 UNIT) CAPS capsule Take 1 capsule (50,000 Units total) by mouth every 7 (seven) days. 5 capsule 2   No current facility-administered medications on file prior to visit.    BP 130/72   Pulse 90   Temp (!) 97 F (36.1 C) (Temporal)   Ht 5\' 2"  (1.575 m)   Wt 214 lb (97.1 kg)   LMP 03/12/2016   SpO2 95%   BMI 39.14 kg/m  Objective:   Physical Exam HENT:     Right Ear: Tympanic membrane and ear canal normal.     Left Ear: Tympanic membrane and ear canal normal.     Nose: Nose normal.  Eyes:     Conjunctiva/sclera: Conjunctivae normal.     Pupils: Pupils are equal, round, and reactive to light.  Neck:     Thyroid: No thyromegaly.  Cardiovascular:     Rate and Rhythm: Normal rate and regular rhythm.     Heart sounds: No murmur heard. Pulmonary:     Effort: Pulmonary effort is normal.     Breath sounds: Normal breath sounds. No rales.  Abdominal:     General: Bowel sounds are normal.     Palpations: Abdomen is soft.     Tenderness: There is no abdominal tenderness.  Musculoskeletal:        General: Normal range of motion.     Cervical back: Neck supple.  Lymphadenopathy:     Cervical: No cervical adenopathy.  Skin:    General: Skin is warm and dry.     Findings: No rash.  Neurological:     Mental Status: She is alert and oriented to person, place, and time.     Cranial Nerves: No cranial nerve deficit.     Deep Tendon Reflexes: Reflexes are normal and symmetric.  Psychiatric:         Mood and Affect: Mood normal.           Assessment & Plan:  Preventative health care Assessment & Plan: Declines pneumonia vaccine and Shingrix vaccines. Pap smear overdue, she continues to decline despite recommendations. Mammogram overdue, she agrees this year.  Orders placed for mobile mammogram unit Colonoscopy overdue, she agrees to complete Cologuard.  Orders placed.  Discussed the importance of a healthy diet and regular exercise in order for  weight loss, and to reduce the risk of further co-morbidity.  Exam stable. Labs pending.  Follow up in 1 year for repeat physical.    Screening mammogram for breast cancer -     3D Screening Mammogram, Left and Right; Future  Screening for colon cancer -     Cologuard  Essential hypertension Assessment & Plan: Controlled.  Continue lisinopril 20 mg daily.  CMP pending.   Type 2 diabetes mellitus with hyperglycemia, without long-term current use of insulin (HCC) Assessment & Plan: Repeat A1C pending.  Continue metformin 500 mg BID.  Follow up in 3-6 months based on A1C result.   Orders: -     Hemoglobin A1c  Other polyneuropathy Assessment & Plan: Controlled.   Continue gabapentin 100 mg HS. She does not take 300 mg.    Generalized anxiety disorder Assessment & Plan: Uncontrolled  Referral placed for therapy.  Continue venlafaxine ER 150 mg daily for now.  Consider switching to Zoloft versus Lexapro. Start buspirone 5 mg twice daily, instructions provided for starting.  She will update in a few weeks.  Orders: -     Ambulatory referral to Psychology  Current moderate episode of major depressive disorder without prior episode [facility]) Assessment & Plan: Improved.  Continue Effexor ER 150 mg daily. Consider switching to Zoloft or Lexapro for better anxiety control. She will update.  Orders: -     Ambulatory referral to Psychology  Hyperlipidemia, unspecified hyperlipidemia type Assessment  & Plan: Repeat lipid panel pending.  She is at high risk for ASCVD given her history of diabetes and hypertension.  Statin intolerance to many medications. Consider Nexlizet versus Zetia alone.  Orders: -     Lipid panel -     Comprehensive metabolic panel  Vitamin D deficiency Assessment & Plan: Continue vitamin D 50,000 IU capsules weekly Repeat vitamin D level pending.  Orders: -     VITAMIN D 25 Hydroxy (Vit-D Deficiency, Fractures)  Statin intolerance Assessment & Plan: Repeat lipid panel pending.  Consider Nexlizet versus Zetia.         Doreene Nest, NP

## 2022-07-06 DIAGNOSIS — Z1211 Encounter for screening for malignant neoplasm of colon: Secondary | ICD-10-CM | POA: Diagnosis not present

## 2022-07-09 DIAGNOSIS — F411 Generalized anxiety disorder: Secondary | ICD-10-CM

## 2022-07-09 DIAGNOSIS — E119 Type 2 diabetes mellitus without complications: Secondary | ICD-10-CM

## 2022-07-09 DIAGNOSIS — E785 Hyperlipidemia, unspecified: Secondary | ICD-10-CM

## 2022-07-09 DIAGNOSIS — Z789 Other specified health status: Secondary | ICD-10-CM

## 2022-07-09 DIAGNOSIS — Z1231 Encounter for screening mammogram for malignant neoplasm of breast: Secondary | ICD-10-CM

## 2022-07-10 MED ORDER — NEXLIZET 180-10 MG PO TABS
1.0000 | ORAL_TABLET | Freq: Every day | ORAL | 3 refills | Status: DC
Start: 2022-07-10 — End: 2022-08-10

## 2022-07-10 MED ORDER — METFORMIN HCL 500 MG PO TABS
1500.0000 mg | ORAL_TABLET | Freq: Every day | ORAL | 0 refills | Status: DC
Start: 2022-07-10 — End: 2023-04-19

## 2022-07-10 MED ORDER — BUSPIRONE HCL 5 MG PO TABS
5.0000 mg | ORAL_TABLET | Freq: Two times a day (BID) | ORAL | 3 refills | Status: DC
Start: 2022-07-10 — End: 2023-07-22

## 2022-07-13 ENCOUNTER — Ambulatory Visit
Admission: RE | Admit: 2022-07-13 | Discharge: 2022-07-13 | Disposition: A | Discharge status: Home / Self Care | Payer: BC Managed Care – PPO | Source: Ambulatory Visit | Attending: Primary Care | Admitting: Primary Care

## 2022-07-13 DIAGNOSIS — Z1231 Encounter for screening mammogram for malignant neoplasm of breast: Secondary | ICD-10-CM | POA: Diagnosis not present

## 2022-07-13 LAB — COLOGUARD: COLOGUARD: NEGATIVE

## 2022-07-15 ENCOUNTER — Other Ambulatory Visit: Payer: Self-pay | Admitting: Primary Care

## 2022-07-15 DIAGNOSIS — N6489 Other specified disorders of breast: Secondary | ICD-10-CM

## 2022-07-15 DIAGNOSIS — R928 Other abnormal and inconclusive findings on diagnostic imaging of breast: Secondary | ICD-10-CM

## 2022-07-18 NOTE — Telephone Encounter (Signed)
Completed forms and placed in [name]'s inbox.

## 2022-07-19 NOTE — Telephone Encounter (Signed)
PPW has been faxed 

## 2022-08-10 ENCOUNTER — Encounter: Payer: Self-pay | Admitting: Primary Care

## 2022-08-10 ENCOUNTER — Ambulatory Visit: Payer: BC Managed Care – PPO | Admitting: Primary Care

## 2022-08-10 VITALS — BP 120/82 | HR 87 | Temp 97.6°F | Ht 62.0 in | Wt 210.0 lb

## 2022-08-10 DIAGNOSIS — M109 Gout, unspecified: Secondary | ICD-10-CM | POA: Diagnosis not present

## 2022-08-10 HISTORY — DX: Gout, unspecified: M10.9

## 2022-08-10 MED ORDER — PREDNISONE 20 MG PO TABS
ORAL_TABLET | ORAL | 0 refills | Status: DC
Start: 2022-08-10 — End: 2023-06-08

## 2022-08-10 NOTE — Assessment & Plan Note (Signed)
Exam and HPI consistent for mild acute gout flare. No complication.   Given that her home treatment has been ineffective, will treat more aggressively.  Start prednisone tablets. Take two tablets my mouth once daily in the morning for four days, then one tablet once daily in the morning for four days.   She will update if not resolved.

## 2022-08-10 NOTE — Patient Instructions (Signed)
Start prednisone tablets. Take two tablets my mouth once daily in the morning for four days, then one tablet once daily in the morning for four days.   Please update me if no resolve.  It was a pleasure to see you today!

## 2022-08-10 NOTE — Progress Notes (Signed)
Subjective:    Patient ID: Kayla Robles, female    DOB: July 07, 1970, 52 y.o.   MRN: [id number]  HPI  Kayla Robles is a very pleasant 52 y.o. female with a history of type 2 diabetes, hypertension, GAD, hyperlipidemia, chronic gout who presents today to discuss toe pain.  Her pain is located to the right great MTP joint which initially began about 4 weeks ago. Since then her pain has wax and waned. Yesterday her pain was much worse. She's also experienced swelling and redness to the MTP joint.  She's been taking Tart Cherry juice, soaking Epsolm salt, and taking Ibuprofen with temporary improvement.   She denies injury, trauma, left foot pain. Overall, she's had about 3 small flares this year for which she's controlled with ibuprofen. This is her first big flare this year thus far.    Review of Systems  Constitutional:  Negative for fever.  Musculoskeletal:  Positive for arthralgias and joint swelling.  Skin:  Positive for color change.  Neurological:  Negative for numbness.         Past Medical History:  Diagnosis Date   Anxiety    Hypertension    Kidney stones    S/P cholecystectomy t   S/P tubal ligation 1992    Social History   Socioeconomic History   Marital status: Married    Spouse name: Not on file   Number of children: Not on file   Years of education: Not on file   Highest education level: 12th grade  Occupational History   Not on file  Tobacco Use   Smoking status: Never   Smokeless tobacco: Never  Substance and Sexual Activity   Alcohol use: No   Drug use: No   Sexual activity: Not on file  Other Topics Concern   Not on file  Social History Narrative   Married.   Works as a Air traffic controller.   Enjoys playing with her grandchildren.   Social Determinants of Health   Financial Resource Strain: Low Risk  (08/09/2022)   Overall Financial Resource Strain (CARDIA)    Difficulty of Paying Living Expenses: Not hard at all  Food Insecurity: No  Food Insecurity (08/09/2022)   Hunger Vital Sign    Worried About Running Out of Food in the Last Year: Never true    Ran Out of Food in the Last Year: Never true  Transportation Needs: No Transportation Needs (08/09/2022)   PRAPARE - Administrator, Civil Service (Medical): No    Lack of Transportation (Non-Medical): No  Physical Activity: Unknown (08/09/2022)   Exercise Vital Sign    Days of Exercise per Week: 0 days    Minutes of Exercise per Session: Not on file  Stress: Stress Concern Present (08/09/2022)   [employer] of Occupational Health - Occupational Stress Questionnaire    Feeling of Stress : Very much  Social Connections: Moderately Integrated (08/09/2022)   Social Connection and Isolation Panel [NHANES]    Frequency of Communication with Friends and Family: More than three times a week    Frequency of Social Gatherings with Friends and Family: Once a week    Attends Religious Services: More than 4 times per year    Active Member of [employer] or Organizations: No    Attends Engineer, structural: Not on file    Marital Status: Married  Catering manager Violence: Not on file    Past Surgical History:  Procedure Laterality Date   CHOLECYSTECTOMY  LITHOTRIPSY      Family History  Problem Relation Age of Onset   Arthritis Mother    Heart disease Mother    Hypertension Mother    Hypertension Father    Diabetes Father     Allergies  Allergen Reactions   Levaquin [Levofloxacin] Anaphylaxis    "felt weird" felt worse than usually felt   Zithromax [Azithromycin] Anaphylaxis   Other Other (See Comments)    Most anxiety medications   Paroxetine Hcl Other (See Comments)    suicidal    Current Outpatient Medications on File Prior to Visit  Medication Sig Dispense Refill   busPIRone (BUSPAR) 5 MG tablet Take 1 tablet (5 mg total) by mouth 2 (two) times daily. For anxiety 180 tablet 3   gabapentin (NEURONTIN) 100 MG capsule Take 3 capsules (300  mg total) by mouth at bedtime. 270 capsule 0   lisinopril (ZESTRIL) 20 MG tablet TAKE 1 TABLET DAILY FOR BLOOD PRESSURE 90 tablet 3   metFORMIN (GLUCOPHAGE) 500 MG tablet Take 3 tablets (1,500 mg total) by mouth daily with breakfast. for diabetes. 270 tablet 0   pravastatin (PRAVACHOL) 40 MG tablet Take 40 mg by mouth daily.     venlafaxine XR (EFFEXOR-XR) 150 MG 24 hr capsule TAKE 1 CAPSULE DAILY WITH BREAKFAST FOR ANXIETY 90 capsule 0   Vitamin D, Ergocalciferol, (DRISDOL) 1.25 MG (50000 UNIT) CAPS capsule Take 1 capsule (50,000 Units total) by mouth every 7 (seven) days. 12 capsule 0   hydrOXYzine (ATARAX) 25 MG tablet TAKE 1 TABLET AT BEDTIME AS NEEDED FOR ANXIETY AND SLEEP (Patient not taking: Reported on 08/10/2022) 90 tablet 0   No current facility-administered medications on file prior to visit.    BP 120/82   Pulse 87   Temp 97.6 F (36.4 C) (Temporal)   Ht 5\' 2"  (1.575 m)   Wt 210 lb (95.3 kg)   LMP 03/12/2016   SpO2 97%   BMI 38.41 kg/m  Objective:   Physical Exam Constitutional:      General: She is not in acute distress. Cardiovascular:     Rate and Rhythm: Normal rate.  Musculoskeletal:     Right foot: Decreased range of motion. Swelling and tenderness present.     Comments: Right MTP joint with mild erythema, swelling, and warmth.   Skin:    General: Skin is warm and dry.     Findings: Erythema present.  Neurological:     Mental Status: She is alert.           Assessment & Plan:  Acute gout involving toe of right foot, unspecified cause Assessment & Plan: Exam and HPI consistent for mild acute gout flare. No complication.   Given that her home treatment has been ineffective, will treat more aggressively.  Start prednisone tablets. Take two tablets my mouth once daily in the morning for four days, then one tablet once daily in the morning for four days.   She will update if not resolved.  Orders: -     predniSONE; Take two tablets my mouth once  daily in the morning for four days, then one tablet once daily in the morning for four days.  Dispense: 12 tablet; Refill: 0        Doreene Nest, NP

## 2022-08-11 ENCOUNTER — Telehealth: Payer: Self-pay | Admitting: Primary Care

## 2022-08-11 NOTE — Telephone Encounter (Signed)
Yes, okay to take NyQuil Z.  Do not take hydroxyzine and NyQuil Z together.

## 2022-08-11 NOTE — Telephone Encounter (Signed)
Patient was placed on prednisone for her gout flare up.She said that usually when placed on this medication she can't sleep the first two days.She would like to now will it be okay to take Nyquil z with the prednisone for her to get some sleep?

## 2022-08-12 NOTE — Telephone Encounter (Signed)
Left message to return call to our office.  

## 2022-08-13 NOTE — Telephone Encounter (Signed)
Patient sent my chart message and was given all information no further action needed.

## 2022-08-19 ENCOUNTER — Other Ambulatory Visit: Payer: Self-pay | Admitting: Primary Care

## 2022-08-19 ENCOUNTER — Other Ambulatory Visit: Payer: Self-pay

## 2022-08-19 DIAGNOSIS — E785 Hyperlipidemia, unspecified: Secondary | ICD-10-CM

## 2022-08-19 MED ORDER — PRAVASTATIN SODIUM 40 MG PO TABS
40.0000 mg | ORAL_TABLET | Freq: Every day | ORAL | 2 refills | Status: DC
  Filled 2022-08-19: qty 30, 30d supply, fill #0
  Filled 2023-07-25: qty 90, 90d supply, fill #0

## 2022-09-06 ENCOUNTER — Other Ambulatory Visit: Payer: Self-pay | Admitting: Primary Care

## 2022-09-06 DIAGNOSIS — F411 Generalized anxiety disorder: Secondary | ICD-10-CM

## 2022-09-07 ENCOUNTER — Ambulatory Visit (INDEPENDENT_AMBULATORY_CARE_PROVIDER_SITE_OTHER): Payer: BC Managed Care – PPO | Admitting: Clinical

## 2022-09-07 DIAGNOSIS — F411 Generalized anxiety disorder: Secondary | ICD-10-CM

## 2022-09-07 DIAGNOSIS — F331 Major depressive disorder, recurrent, moderate: Secondary | ICD-10-CM | POA: Diagnosis not present

## 2022-09-07 NOTE — Progress Notes (Signed)
                Karen Sharpe, LCSW 
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
## 2022-09-22 ENCOUNTER — Ambulatory Visit: Payer: BC Managed Care – PPO | Admitting: Clinical

## 2022-09-22 DIAGNOSIS — F331 Major depressive disorder, recurrent, moderate: Secondary | ICD-10-CM | POA: Diagnosis not present

## 2022-09-22 DIAGNOSIS — F439 Reaction to severe stress, unspecified: Secondary | ICD-10-CM

## 2022-09-22 DIAGNOSIS — F411 Generalized anxiety disorder: Secondary | ICD-10-CM | POA: Diagnosis not present

## 2022-09-22 NOTE — Progress Notes (Signed)
[facility] Counselor/Therapist Progress Note  Patient ID: Kayla Robles, MRN: [id number]    Date: 09/22/22  Time Spent: 10:34  am - 11:42 am : 68 Minutes  Treatment Type: Individual Therapy.  Reported Symptoms: Patient reported difficulty sleeping and nightmares recently and is experiencing difficulty concentrating  Mental Status Exam: Appearance:  Neat and Well Groomed     Behavior: Appropriate  Motor: Normal  Speech/Language:  Clear and Coherent  Affect: Tearful  Mood: anxious and depressed  Thought process: normal  Thought content:   WNL  Sensory/Perceptual disturbances:   WNL  Orientation: oriented to person, place, and situation  Attention: Good  Concentration: Good  Memory: WNL  Fund of knowledge:  Good  Insight:   Fair  Judgment:  Good  Impulse Control: Good   Risk Assessment: Danger to Self:  No Patient denied current suicidal ideation  Self-injurious Behavior: No Danger to Others: No Patient denied current homicidal ideation Duty to Warn:no Physical Aggression / Violence:No  Access to Firearms a concern: No  Gang Involvement:No   Subjective:  Patient stated, "I've had a rough couple of weeks, its gotten worse".  Patient reported she has experienced difficulty breathing when she feels upset. Patient stated, "its still up and down" in response to patient's mood today. Patient reported she has been experiencing nightmares frequently and reported a history of nightmares.  Patient reported her grandfather committed suicide by use of a firearm. Patient reported family history of suicide is a protective factor for patient's safety. Patient reported difficulty concentrating. Patient reported her mother passed away several days after her mother was discharged from the hospital. Patient reported she had to make the decision regarding putting her mother on life support and reported she made the decision not to put her mother on life support based on a  previous conversation with her mother. Patient reported feeling she is being watched every where she goes. Patient reported difficulty trusting others. Patient reported a historical diagnosis of PTSD due to childhood trauma.  Open to referral to Cire Deyarmin [place] at [facility].   Interventions: Motivational Interviewing. Clinician conducted session in person at clinician's office at [facility]. Provided supportive therapy, active listening, and validation as patient discussed circumstances surrounding her mother's passing. Clinician reviewed diagnoses and treatment recommendations. Clinician recommended patient participate in individual therapy with a provider that specializes in treatment of trauma based on additional information patient provided during today's session.  Provided psycho education related to diagnosis and treatment. Discussed clinician's scope of practice and the importance of connecting patient with a provider that can address patient's clinical needs. Clinician will initiate a referral to a provider that can address patient's clinical needs.   Collaboration of Care: Other Discussed consents required for referral   Diagnosis:  Generalized anxiety disorder  Major depressive disorder, recurrent episode, moderate (HCC)  Unspecified trauma - and - stressor related disorder  Plan: Clinician will initiate a referral to a provider that specializes in treatment of trauma.                  Doree Barthel, LCSW

## 2022-10-06 ENCOUNTER — Other Ambulatory Visit: Payer: Self-pay | Admitting: Primary Care

## 2022-10-06 DIAGNOSIS — E559 Vitamin D deficiency, unspecified: Secondary | ICD-10-CM

## 2022-10-12 ENCOUNTER — Ambulatory Visit: Payer: BC Managed Care – PPO | Admitting: Clinical

## 2022-10-21 ENCOUNTER — Ambulatory Visit: Payer: BC Managed Care – PPO | Admitting: Internal Medicine

## 2022-12-19 ENCOUNTER — Other Ambulatory Visit: Payer: Self-pay | Admitting: Family Medicine

## 2022-12-19 DIAGNOSIS — G8929 Other chronic pain: Secondary | ICD-10-CM

## 2022-12-30 ENCOUNTER — Encounter: Payer: Self-pay | Admitting: Primary Care

## 2022-12-30 LAB — HM DIABETES EYE EXAM

## 2022-12-30 NOTE — Telephone Encounter (Signed)
Letter sent requesting diabetic eye records to [facility]

## 2023-03-15 NOTE — Telephone Encounter (Signed)
Harvin Hazel, will you fax her FMLA forms? Placed in your inbox.

## 2023-03-16 NOTE — Telephone Encounter (Signed)
FMLA forms faxed 02/18 as requested.

## 2023-04-06 DIAGNOSIS — R112 Nausea with vomiting, unspecified: Secondary | ICD-10-CM | POA: Diagnosis not present

## 2023-04-06 DIAGNOSIS — R531 Weakness: Secondary | ICD-10-CM | POA: Diagnosis not present

## 2023-04-06 DIAGNOSIS — R5383 Other fatigue: Secondary | ICD-10-CM | POA: Diagnosis not present

## 2023-04-06 DIAGNOSIS — E860 Dehydration: Secondary | ICD-10-CM | POA: Diagnosis not present

## 2023-04-12 ENCOUNTER — Emergency Department
Admission: EM | Admit: 2023-04-12 | Discharge: 2023-04-12 | Disposition: A | Discharge status: Home / Self Care | Attending: Emergency Medicine | Admitting: Emergency Medicine

## 2023-04-12 ENCOUNTER — Other Ambulatory Visit: Payer: Self-pay

## 2023-04-12 ENCOUNTER — Encounter: Payer: Self-pay | Admitting: Emergency Medicine

## 2023-04-12 DIAGNOSIS — E119 Type 2 diabetes mellitus without complications: Secondary | ICD-10-CM | POA: Diagnosis not present

## 2023-04-12 DIAGNOSIS — J0100 Acute maxillary sinusitis, unspecified: Secondary | ICD-10-CM | POA: Insufficient documentation

## 2023-04-12 LAB — COMPREHENSIVE METABOLIC PANEL
ALT: 56 U/L — ABNORMAL HIGH (ref 0–44)
AST: 52 U/L — ABNORMAL HIGH (ref 15–41)
Albumin: 4.1 g/dL (ref 3.5–5.0)
Alkaline Phosphatase: 51 U/L (ref 38–126)
Anion gap: 13 (ref 5–15)
BUN: 14 mg/dL (ref 6–20)
CO2: 21 mmol/L — ABNORMAL LOW (ref 22–32)
Calcium: 9.8 mg/dL (ref 8.9–10.3)
Chloride: 103 mmol/L (ref 98–111)
Creatinine, Ser: 0.73 mg/dL (ref 0.44–1.00)
GFR, Estimated: >60 mL/min (ref >60)
Glucose, Bld: 275 mg/dL — ABNORMAL HIGH (ref 70–99)
Potassium: 3.8 mmol/L (ref 3.5–5.1)
Sodium: 137 mmol/L (ref 135–145)
Total Bilirubin: 0.5 mg/dL (ref 0.0–1.2)
Total Protein: 7.3 g/dL (ref 6.5–8.1)

## 2023-04-12 LAB — CBC
HCT: 37.1 % (ref 36.0–46.0)
Hemoglobin: 13.2 g/dL (ref 12.0–15.0)
MCH: 31.3 pg (ref 26.0–34.0)
MCHC: 35.6 g/dL (ref 30.0–36.0)
MCV: 87.9 fL (ref 80.0–100.0)
Platelets: 269 10*3/uL (ref 150–400)
RBC: 4.22 MIL/uL (ref 3.87–5.11)
RDW: 11.8 % (ref 11.5–15.5)
WBC: 8.3 10*3/uL (ref 4.0–10.5)
nRBC: 0 % (ref 0.0–0.2)

## 2023-04-12 LAB — URINALYSIS, ROUTINE W REFLEX MICROSCOPIC
Bacteria, UA: NONE SEEN
Bilirubin Urine: NEGATIVE
Glucose, UA: >=500 mg/dL — AB
Hgb urine dipstick: NEGATIVE
Ketones, ur: 5 mg/dL — AB
Leukocytes,Ua: NEGATIVE
Nitrite: NEGATIVE
Protein, ur: NEGATIVE mg/dL
Specific Gravity, Urine: 1.015 (ref 1.005–1.030)
pH: 5 (ref 5.0–8.0)

## 2023-04-12 LAB — RESP PANEL BY RT-PCR (RSV, FLU A&B, COVID)  RVPGX2
Influenza A by PCR: NEGATIVE
Influenza B by PCR: NEGATIVE
Resp Syncytial Virus by PCR: NEGATIVE
SARS Coronavirus 2 by RT PCR: NEGATIVE

## 2023-04-12 LAB — LIPASE, BLOOD: Lipase: 42 U/L (ref 11–51)

## 2023-04-12 MED ORDER — AMOXICILLIN-POT CLAVULANATE ER 1000-62.5 MG PO TB12: 1.0000 | ORAL_TABLET | Freq: Two times a day (BID) | ORAL | 0 refills | Status: AC

## 2023-04-12 MED ORDER — AMOXICILLIN-POT CLAVULANATE ER 1000-62.5 MG PO TB12: 1.0000 | ORAL_TABLET | Freq: Two times a day (BID) | ORAL | 0 refills | Status: DC

## 2023-04-12 NOTE — Discharge Instructions (Addendum)
 You have been diagnosed with sinus infection, dizziness.  Please take Augmentin 1 tablet by mouth every 12 hours daily for 7 days.  Please take DayQuil sinus to improve the nasal congestion.  You can do sinus rinse every day.  Please take Zofran 20 minutes before main meals for nauseas and vomit.  Please drink plenty of fluids.  Please follow-up with your PCP to check liver function test.  Please come back to ED or go to your PCP if you have new symptoms or symptoms worsen

## 2023-04-12 NOTE — ED Triage Notes (Signed)
 Patient to ED via POV for bilateral ear pain and upper back pain. States she has felt fatigue x14 days. NAD noted. Also stating intermittent diarrhea during this time as well.

## 2023-04-12 NOTE — ED Provider Notes (Signed)
 [facility] Provider Note    Event Date/Time   First MD Initiated Contact with Patient 04/12/23 1552     (approximate)   History   Diarrhea   HPI  Kayla Robles is a 53 y.o. female who presents today with history of 14 days with flulike symptoms that were improving until yesterday patient was feeling weak, with bodyaches, nauseous, abdominal pain that comes and goes and diarrhea.  Patient states having dizziness, that increased when she is bending her head and bilateral ear pain.  Patient went to [facility] they diagnosed dehydration, and she was treated with Gatorade in the last 2 days.  Patient has history of anxiety, and diabetes   Physical Exam   Triage Vital Signs: ED Triage Vitals  Encounter Vitals Group     BP 04/12/23 1323 (!) (P) 140/70     Systolic BP Percentile --      Diastolic BP Percentile --      Pulse Rate 04/12/23 1323 (P) 100     Resp 04/12/23 1327 18     Temp 04/12/23 1323 (P) 98.7 F (37.1 C)     Temp Source 04/12/23 1323 (P) Oral     SpO2 04/12/23 1323 (P) 94 %     Weight 04/12/23 1329 208 lb (94.3 kg)     Height 04/12/23 1329 5\' 2"  (1.575 m)     Head Circumference --      Peak Flow --      Pain Score 04/12/23 1327 2     Pain Loc --      Pain Education --      Exclude from Growth Chart --     Most recent vital signs: Vitals:   04/12/23 1327 04/12/23 1436  BP:  (!) 159/86  Pulse:  94  Resp: 18 20  Temp:    SpO2:  98%     Constitutional: Alert, NAD. Able to speak in complete sentences without cough or dyspnea  Eyes: Conjunctivae are normal.  Head: Atraumatic. Ears: Right otoscopy: Presence of fluid behind tympanic membrane.  Left otoscopy: Within normal limits Nose: No congestion/rhinnorhea. Face: Tender to palpation in maxillary sinuses.  Mouth/Throat: Mucous membranes are moist.   Neck: Painless ROM. Supple. No JVD, nodes, thyromegaly  Cardiovascular:   Good peripheral circulation.RRR no murmurs,  gallops, rubs  Respiratory: Normal respiratory effort.  No retractions. Clear to auscultation bilaterally without wheezing or crackles  Gastrointestinal: Skin without scars, bowel sounds positive.  soft and nontender.  Musculoskeletal:  no deformity Neurologic:  MAE spontaneously. No gross focal neurologic deficits are appreciated.  Skin:  Skin is warm, dry and intact. No rash noted. Psychiatric: Mood and affect are normal. Speech and behavior are normal.    ED Results / Procedures / Treatments   Labs (all labs ordered are listed, but only abnormal results are displayed) Labs Reviewed  COMPREHENSIVE METABOLIC PANEL - Abnormal; Notable for the following components:      Result Value   CO2 21 (*)    Glucose, Bld 275 (*)    AST 52 (*)    ALT 56 (*)    All other components within normal limits  URINALYSIS, ROUTINE W REFLEX MICROSCOPIC - Abnormal; Notable for the following components:   Color, Urine STRAW (*)    APPearance CLEAR (*)    Glucose, UA >=500 (*)    Ketones, ur 5 (*)    All other components within normal limits  RESP PANEL BY RT-PCR (RSV, FLU A&B,  COVID)  RVPGX2  LIPASE, BLOOD  CBC     EKG     RADIOLOGY I independently reviewed and interpreted imaging and agree with radiologists findings.      PROCEDURES:  Critical Care performed:   Procedures   MEDICATIONS ORDERED IN ED: Medications - No data to display Clinical Course as of 04/12/23 1640  Tue Apr 12, 2023  1556 Urinalysis, Routine w reflex microscopic -Urine, Clean Catch(!) Glucose: 500, ketones positive [AE]  1556 Comprehensive metabolic panel(!) Electrolytes, renal function within normal limits, AST and ALT elevated, glucose 275 [AE]  1557 Lipase, blood Within normal limits [AE]  1557 Resp panel by RT-PCR (RSV, Flu A&B, Covid) Anterior Nasal Swab Negative [AE]  1557 CBC Blood cells and hemoglobin within normal limits [AE]    Clinical Course User Index [AE] Gladys Damme, PA-C     IMPRESSION / MDM / ASSESSMENT AND PLAN / ED COURSE  I reviewed the triage vital signs and the nursing notes.  Differential diagnosis includes, but is not limited to, sinus infection, pneumonia, influenza, viral gastroenteritis, anxiety  Patient's presentation is most consistent with acute complicated illness / injury requiring diagnostic workup.   Patient's diagnosis is consistent with sinus infection. I independently reviewed and interpreted imaging and agree with radiologists findings. Labs are  reassuring. I did review the patient's allergies and medications.The patient is in stable and satisfactory condition for discharge home  Patient will be discharged home with prescriptions for azithromycin.  Patient has at home Zofran for nauseas.  Patient is to follow up with PCP to follow-up liver function test as needed or otherwise directed. Patient is given ED precautions to return to the ED for any worsening or new symptoms. Discussed plan of care with patient, answered all of patient's questions, Patient agreeable to plan of care. Advised patient to take medications according to the instructions on the label. Discussed possible side effects of new medications. Patient verbalized understanding.    FINAL CLINICAL IMPRESSION(S) / ED DIAGNOSES   Final diagnoses:  Acute non-recurrent maxillary sinusitis     Rx / DC Orders   ED Discharge Orders          Ordered    amoxicillin-clavulanate (AUGMENTIN XR) 1000-62.5 MG 12 hr tablet  2 times daily        04/12/23 1638             Note:  This document was prepared using Dragon voice recognition software and may include unintentional dictation errors.   Gladys Damme, PA-C 04/12/23 1640    Chesley Noon, MD 04/12/23 [phone]

## 2023-04-13 ENCOUNTER — Telehealth: Payer: Self-pay | Admitting: Primary Care

## 2023-04-13 ENCOUNTER — Ambulatory Visit: Payer: Self-pay | Admitting: Primary Care

## 2023-04-13 ENCOUNTER — Telehealth: Payer: Self-pay

## 2023-04-13 NOTE — Telephone Encounter (Signed)
 E2C2 nurse called asking for Kayla Robles to review her triage notes for pt. Nurse states pt is upset & is demanding her work note be extended by [employer]. Nurse states the pt visited the ER on yesterday, 3/18. I spoke to pt earlier to schedule [name] f/u for next available slot on Tues, 3/25 wih [place], pt agreed with appt time/date. Nurse states pt complained about ear pain & dizziness. Please advise. Call back # [phone].

## 2023-04-13 NOTE — Telephone Encounter (Signed)
 Noted. We are concerned about her symptoms which is why we would like to evaluate. Will evaluate patient as scheduled if she does not cancel appointment.

## 2023-04-13 NOTE — Telephone Encounter (Signed)
 See other phone note

## 2023-04-13 NOTE — Telephone Encounter (Signed)
 In order for me to extend her time off from work she will need to be evaluated by me.

## 2023-04-13 NOTE — Telephone Encounter (Signed)
 Chief Complaint: dizziness, ear pain, requesting work note for missed absences Symptoms: dizziness, ear pain .  Anxious  Frequency: yesterday  Pertinent Negatives: Patient denies chest pain no difficulty breathing , no fever  Disposition: [] ED /[] Urgent Care (no appt availability in office) / [] Appointment(In office/virtual)/ []  [place] [facility]/ [] Home Care/ [] Refused Recommended Disposition /[]  Mobile Bus/ [x]  Follow-up with PCP Additional Notes:   Please advise. Patient requesting work note be extended to be out of work this week due to continued sx of dizziness and ear pain. Last seen [facility] -ED 04/12/23.  Hospital ED f/u appt not until 04/18/23 with PCP. Patient has requested Evans, PA from ED to rewrite or extend date for return to work until next week instead of 04/14/23. 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 Offered appt for Friday with PCP for my chart VV . Patient declined due to financial strain from going to walk in clinics to ED yesterday and paying for medication, she can not afford another Dr visit if possible. Contacted charge nurse at [facility] ED to review process for notifying PA that treated patient. PA not available and charge nurse reports patient told to f/u with PCP for further assistance.  Please advise . Patient would like a call back how to proceed.  Patient very upset and anxious with NT regarding protocols to meet to help with extended out of work time.        Copied from CRM [id number] [id number]. Topic: Clinical - Red Word Triage >> Apr 13, 2023  9:52 AM Mackie Pai E wrote: [facility] that prompted transfer to Nurse Triage: Patient has been experiencing some severe dizziness when she is up and walking, but when she lays down it is not as intense. Patient was in the ED 3/18 and got put on amoxicillin-clavulanate (AUGMENTIN XR) 1000-62.5 MG 12 hr tablet and patient states that is is a strong dosage, and doesn't  know if this contributes to the problem. Reason for Disposition  [1] MODERATE dizziness (e.g., interferes with normal activities) AND [2] has been evaluated by doctor (or NP/PA) for this  Answer Assessment - Initial Assessment Questions 1. DESCRIPTION: "Describe your dizziness."     Dizziness  2. LIGHTHEADED: "Do you feel lightheaded?" (e.g., somewhat faint, woozy, weak upon standing)     Na  3. VERTIGO: "Do you feel like either you or the room is spinning or tilting?" (i.e. vertigo)     na 4. SEVERITY: "How bad is it?"  "Do you feel like you are going to faint?" "Can you stand and walk?"   - MILD: Feels slightly dizzy, but walking normally.   - MODERATE: Feels unsteady when walking, but not falling; interferes with normal activities (e.g., school, work).   - SEVERE: Unable to walk without falling, or requires assistance to walk without falling; feels like passing out now.      Can walk but still reports feeling dizzy 5. ONSET:  "When did the dizziness begin?"     See report from ED yesterday  6. AGGRAVATING FACTORS: "Does anything make it worse?" (e.g., standing, change in head position)     Na  7. HEART RATE: "Can you tell me your heart rate?" "How many beats in 15 seconds?"  (Note: not all patients can do this)       na 8. CAUSE: "What do you think is causing the dizziness?"     Recent sickness abdominal pain diarrhea, ear pain 9.  RECURRENT SYMPTOM: "Have you had dizziness before?" If Yes, ask: "When was the last time?" "What happened that time?"     Yes  10. OTHER SYMPTOMS: "Do you have any other symptoms?" (e.g., fever, chest pain, vomiting, diarrhea, bleeding)       Ear pain dizziness 11. PREGNANCY: "Is there any chance you are pregnant?" "When was your last menstrual period?"       na  Protocols used: Dizziness - Lightheadedness-A-AH

## 2023-04-13 NOTE — Transitions of Care (Post Inpatient/ED Visit) (Signed)
 04/13/2023  Name: Kayla Robles MRN: [id number] DOB: 1971/01/17  Today's TOC FU Call Status: Today's TOC FU Call Status:: Successful TOC FU Call Completed TOC FU Call Complete Date: 04/13/23 Patient's Name and Date of Birth confirmed.  Transition Care Management Follow-up Telephone Call Date of Discharge: 04/12/23 Discharge Facility: [facility]) Type of Discharge: Emergency Department Reason for ED Visit: Other: (sinusitis) How have you been since you were released from the hospital?: Same Any questions or concerns?: Yes  Items Reviewed: Did you receive and understand the discharge instructions provided?: Yes Medications obtained,verified, and reconciled?: Yes (Medications Reviewed) Any new allergies since your discharge?: Yes Dietary orders reviewed?: NA Do you have support at home?: Yes People in Home: spouse  Medications Reviewed Today: Medications Reviewed Today     Reviewed by Karena Addison, LPN (Licensed Practical Nurse) on 04/13/23 at 1443  Med List Status: <None>   Medication Order Taking? Sig Documenting Provider Last Dose Status Informant  amoxicillin-clavulanate (AUGMENTIN XR) 1000-62.5 MG 12 hr tablet [id number]  Take 1 tablet by mouth 2 (two) times daily for 7 days. Gladys Damme, PA-C  Active   busPIRone (BUSPAR) 5 MG tablet [id number] No Take 1 tablet (5 mg total) by mouth 2 (two) times daily. For anxiety Doreene Nest, NP Taking Active   gabapentin (NEURONTIN) 100 MG capsule [id number]  Take 1 capsule (100 mg total) by mouth at bedtime. For pain and sleep Doreene Nest, NP  Active   hydrOXYzine (ATARAX) 25 MG tablet [id number] No TAKE 1 TABLET AT BEDTIME AS NEEDED FOR ANXIETY AND SLEEP  Patient not taking: Reported on 08/10/2022   Doreene Nest, NP Not Taking Active   lisinopril (ZESTRIL) 20 MG tablet [id number] No TAKE 1 TABLET DAILY FOR BLOOD PRESSURE Doreene Nest, NP Taking Active   metFORMIN (GLUCOPHAGE)  500 MG tablet [id number] No Take 3 tablets (1,500 mg total) by mouth daily with breakfast. for diabetes. Doreene Nest, NP Taking Active   pravastatin (PRAVACHOL) 40 MG tablet [id number]  Take 1 tablet (40 mg total) by mouth daily. for cholesterol. Doreene Nest, NP  Active   predniSONE (DELTASONE) 20 MG tablet [id number]  Take two tablets my mouth once daily in the morning for four days, then one tablet once daily in the morning for four days. Doreene Nest, NP  Active   venlafaxine XR (EFFEXOR-XR) 150 MG 24 hr capsule [id number]  TAKE 1 CAPSULE DAILY WITH BREAKFAST FOR ANXIETY Doreene Nest, NP  Active   Vitamin D, Ergocalciferol, (DRISDOL) 1.25 MG (50000 UNIT) CAPS capsule [id number]  TAKE 1 CAPSULE EVERY 7 DAYS Doreene Nest, NP  Active             Home Care and Equipment/Supplies: Were Home Health Services Ordered?: NA Any new equipment or medical supplies ordered?: NA  Functional Questionnaire: Do you need assistance with bathing/showering or dressing?: No Do you need assistance with meal preparation?: No Do you need assistance with eating?: No Do you have difficulty maintaining continence: No Do you need assistance with getting out of bed/getting out of a chair/moving?: No Do you have difficulty managing or taking your medications?: No  Follow up appointments reviewed: PCP Follow-up appointment confirmed?: Yes Date of PCP follow-up appointment?: 04/19/23 Follow-up Provider: [facility] Follow-up appointment confirmed?: NA Do you need transportation to your follow-up appointment?: No Do you understand care options if your condition(s) worsen?: Yes-patient verbalized understanding    SIGNATURE Karena Addison, LPN [facility] [facility] [employer]  Nurse Health Advisor Direct Dial [phone]

## 2023-04-13 NOTE — Telephone Encounter (Signed)
 Called and spoke with patient, advised of Kayla Robles message. Patient was verbally upset and frustrated with the [facility] system as a whole. She stated she will not be giving  any more of her money through appts or anything. Patient stated she will be finding a new doctor and calling back to cancel appts if this is how [facility] treats patients. I offered apologizes for the inconvenience she has been through with getting her letter updated from what she stated was an error on the ED doctors part. She again stated how she does not understand why no one is concerned about her dizziness and being at work with machinery. Advised patient that is not our intention to ignore the concerns with her health, only in order to write her out of work Jae Dire must see her for an evaluation and can not go off of the ED visit she had. Patient is scheduled with Kayla Robles next avaliable on March 25th, to which the patient stated multiple times she would be calling back to cancel.

## 2023-04-13 NOTE — Telephone Encounter (Signed)
 Unable to reach patient. Left voicemail to return call to our office.

## 2023-04-14 ENCOUNTER — Ambulatory Visit: Admitting: Family Medicine

## 2023-04-14 ENCOUNTER — Encounter: Payer: Self-pay | Admitting: Primary Care

## 2023-04-14 ENCOUNTER — Ambulatory Visit: Admitting: Primary Care

## 2023-04-14 ENCOUNTER — Encounter: Payer: Self-pay | Admitting: *Deleted

## 2023-04-14 VITALS — BP 148/84 | HR 97 | Temp 97.0°F | Ht 62.0 in | Wt 208.0 lb

## 2023-04-14 DIAGNOSIS — J019 Acute sinusitis, unspecified: Secondary | ICD-10-CM | POA: Insufficient documentation

## 2023-04-14 DIAGNOSIS — E1165 Type 2 diabetes mellitus with hyperglycemia: Secondary | ICD-10-CM

## 2023-04-14 DIAGNOSIS — R7989 Other specified abnormal findings of blood chemistry: Secondary | ICD-10-CM

## 2023-04-14 DIAGNOSIS — Z7984 Long term (current) use of oral hypoglycemic drugs: Secondary | ICD-10-CM | POA: Diagnosis not present

## 2023-04-14 DIAGNOSIS — Z7985 Long-term (current) use of injectable non-insulin antidiabetic drugs: Secondary | ICD-10-CM

## 2023-04-14 LAB — POCT GLYCOSYLATED HEMOGLOBIN (HGB A1C): Hemoglobin A1C: 8.7 % — AB (ref 4.0–5.6)

## 2023-04-14 MED ORDER — OZEMPIC (0.25 OR 0.5 MG/DOSE) 2 MG/3ML ~~LOC~~ SOPN
PEN_INJECTOR | SUBCUTANEOUS | 0 refills | Status: DC
Start: 2023-04-14 — End: 2023-06-29

## 2023-04-14 NOTE — Assessment & Plan Note (Signed)
 With recent urgent care and ED visits. Urgent care and ED notes and labs reviewed.  Continue Augmentin 1000-62.5 mg twice daily until complete.  Agree to provide work note to excuse her for March 20 and 21.

## 2023-04-14 NOTE — Assessment & Plan Note (Signed)
 Slightly elevated.  Could be secondary to fat development on the liver versus statin therapy. We discussed obtaining a liver ultrasound, she will complete this when ready.

## 2023-04-14 NOTE — Patient Instructions (Addendum)
 Start semaglutide (Ozempic) for diabetes. Start by injecting 0.25 mg into the skin once weekly for 4 weeks, then increase to 0.5 mg once weekly thereafter.   Continue metformin.  You will be contacted regarding the ultrasound of your liver.  Please schedule a physical to meet with me in 3 months.   It was a pleasure to see you today!

## 2023-04-14 NOTE — Assessment & Plan Note (Signed)
 Deteriorated with A1C of 8.7 today.  Discussed to work on diet.   Continue metformin 1500 mg daily.  Start semaglutide (Ozempic) for diabetes. Start by injecting 0.25 mg into the skin once weekly for 4 weeks, then increase to 0.5 mg once weekly thereafter.  We discussed potential side effects and instructions for initiation.  Urine microalbumin done pending. Follow-up in 3 months.

## 2023-04-14 NOTE — Progress Notes (Signed)
 Subjective:    Patient ID: Kayla Robles, female    DOB: [birth year], 53 y.o.   MRN: [id number]  HPI  Kayla Robles is a very pleasant 53 y.o. female with a history of type 2 diabetes, GAD, hyperlipidemia, hypertension who presents today for follow-up of diabetes and ED follow-up.  1) ED & Urgent Care Follow Up:   She originally presented to [facility] clinic urgent care on 04/06/2023 for increased anxiety, acute fatigue with nausea and diarrhea, left-sided headaches, shortness of breath.  During her urgent care visit she completed lab work including CMP, CBC with differential, urinalysis.  Labs revealed elevation in liver enzymes she was diagnosed with dehydration and advised to follow-up with PCP.  She presented to [facility] ED on 04/12/2023 for a 14-day history of flulike symptoms including weakness, body aches, nausea, abdominal pain, intermittent diarrhea, inability to walk due to dizziness.  Also with head pressure and bilateral ear pain.  During her stay in the ED she completed lab work which was negative for urinary tract infection.  Liver enzymes are slightly elevated.  CBC with differential was negative.  She was diagnosed with acute sinusitis and prescribed Augmentin 1000-62.5 mg BID.   Today she's starting to feel better. Her dizziness has improved. She doesn't feel ready to return to work just yet due to dizziness and head pressure. She feels her head and ear congestion is improving. Her diarrhea has improved. She is compliant to her Augmentin. 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   2) Type 2 Diabetes:  Current medications include: Metformin 1500 mg daily.  She is interested in trying Ozempic for diabetes and weight loss.  She is checking her blood glucose infrequently and is getting readings of low to mid 200s.   Last A1C: 7.9 in June 2024, 8.7 today.  Last Eye Exam: UTD Last Foot Exam: Due Pneumonia Vaccination: Never completed  Urine  Microalbumin: Due  Statin: pravastatin   Dietary changes since last visit: Poor diet over the last few months with sweet tea, sweets, junk food.    Exercise: None.   BP Readings from Last 3 Encounters:  04/14/23 (!) 148/84  04/12/23 (!) 159/86  08/10/22 120/82     Review of Systems  Constitutional:  Positive for fatigue.  HENT:  Positive for sinus pressure.   Respiratory:  Negative for shortness of breath.   Gastrointestinal:  Positive for nausea. Negative for diarrhea and vomiting.  Neurological:  Positive for dizziness and light-headedness.         Past Medical History:  Diagnosis Date   Anxiety    Hypertension    Kidney stones    S/P cholecystectomy t   S/P tubal ligation 1992    Social History   Socioeconomic History   Marital status: Married    Spouse name: Not on file   Number of children: Not on file   Years of education: Not on file   Highest education level: 12th grade  Occupational History   Not on file  Tobacco Use   Smoking status: Never   Smokeless tobacco: Never  Substance and Sexual Activity   Alcohol use: No   Drug use: No   Sexual activity: Not on file  Other Topics Concern   Not on file  Social History Narrative   Married.   Works as a Air traffic controller.   Enjoys playing with her grandchildren.   Social Drivers of Corporate investment banker Strain:  Low Risk  (08/09/2022)   Overall Financial Resource Strain (CARDIA)    Difficulty of Paying Living Expenses: Not hard at all  Food Insecurity: No Food Insecurity (08/09/2022)   Hunger Vital Sign    Worried About Running Out of Food in the Last Year: Never true    Ran Out of Food in the Last Year: Never true  Transportation Needs: No Transportation Needs (08/09/2022)   PRAPARE - Administrator, Civil Service (Medical): No    Lack of Transportation (Non-Medical): No  Physical Activity: Unknown (08/09/2022)   Exercise Vital Sign    Days of Exercise per Week: 0 days    Minutes of  Exercise per Session: Not on file  Stress: Stress Concern Present (08/09/2022)   [employer] of Occupational Health - Occupational Stress Questionnaire    Feeling of Stress : Very much  Social Connections: Moderately Integrated (08/09/2022)   Social Connection and Isolation Panel [NHANES]    Frequency of Communication with Friends and Family: More than three times a week    Frequency of Social Gatherings with Friends and Family: Once a week    Attends Religious Services: More than 4 times per year    Active Member of [employer] or Organizations: No    Attends Engineer, structural: Not on file    Marital Status: Married  Catering manager Violence: Not on file    Past Surgical History:  Procedure Laterality Date   CHOLECYSTECTOMY     LITHOTRIPSY      Family History  Problem Relation Age of Onset   Arthritis Mother    Heart disease Mother    Hypertension Mother    Hypertension Father    Diabetes Father     Allergies  Allergen Reactions   Levaquin [Levofloxacin] Anaphylaxis    "felt weird" felt worse than usually felt   Zithromax [Azithromycin] Anaphylaxis   Other Other (See Comments)    Most anxiety medications   Paroxetine Hcl Other (See Comments)    suicidal    Current Outpatient Medications on File Prior to Visit  Medication Sig Dispense Refill   amoxicillin-clavulanate (AUGMENTIN XR) 1000-62.5 MG 12 hr tablet Take 1 tablet by mouth 2 (two) times daily for 7 days. 14 tablet 0   busPIRone (BUSPAR) 5 MG tablet Take 1 tablet (5 mg total) by mouth 2 (two) times daily. For anxiety 180 tablet 3   gabapentin (NEURONTIN) 100 MG capsule Take 1 capsule (100 mg total) by mouth at bedtime. For pain and sleep 90 capsule 1   lisinopril (ZESTRIL) 20 MG tablet TAKE 1 TABLET DAILY FOR BLOOD PRESSURE 90 tablet 3   metFORMIN (GLUCOPHAGE) 500 MG tablet Take 3 tablets (1,500 mg total) by mouth daily with breakfast. for diabetes. 270 tablet 0   venlafaxine XR (EFFEXOR-XR) 150 MG 24  hr capsule TAKE 1 CAPSULE DAILY WITH BREAKFAST FOR ANXIETY 90 capsule 2   Vitamin D, Ergocalciferol, (DRISDOL) 1.25 MG (50000 UNIT) CAPS capsule TAKE 1 CAPSULE EVERY 7 DAYS 12 capsule 2   hydrOXYzine (ATARAX) 25 MG tablet TAKE 1 TABLET AT BEDTIME AS NEEDED FOR ANXIETY AND SLEEP (Patient not taking: Reported on 04/14/2023) 90 tablet 0   pravastatin (PRAVACHOL) 40 MG tablet Take 1 tablet (40 mg total) by mouth daily. for cholesterol. (Patient not taking: Reported on 04/14/2023) 90 tablet 2   predniSONE (DELTASONE) 20 MG tablet Take two tablets my mouth once daily in the morning for four days, then one tablet once daily in the morning  for four days. (Patient not taking: Reported on 04/14/2023) 12 tablet 0   No current facility-administered medications on file prior to visit.    BP (!) 148/84   Pulse 97   Temp (!) 97 F (36.1 C) (Temporal)   Ht 5\' 2"  (1.575 m)   Wt 208 lb (94.3 kg)   LMP 03/12/2016   SpO2 100%   BMI 38.04 kg/m  Objective:   Physical Exam Cardiovascular:     Rate and Rhythm: Normal rate and regular rhythm.  Pulmonary:     Effort: Pulmonary effort is normal.     Breath sounds: Normal breath sounds.  Musculoskeletal:     Cervical back: Neck supple.  Skin:    General: Skin is warm and dry.  Neurological:     Mental Status: She is alert and oriented to person, place, and time.  Psychiatric:        Mood and Affect: Mood normal.           Assessment & Plan:  Type 2 diabetes mellitus with hyperglycemia, without long-term current use of insulin (HCC) Assessment & Plan: Deteriorated with A1C of 8.7 today.  Discussed to work on diet.   Continue metformin 1500 mg daily.  Start semaglutide (Ozempic) for diabetes. Start by injecting 0.25 mg into the skin once weekly for 4 weeks, then increase to 0.5 mg once weekly thereafter.  We discussed potential side effects and instructions for initiation.  Urine microalbumin done pending. Follow-up in 3 months.  Orders: -      POCT glycosylated hemoglobin (Hb A1C) -     Microalbumin / creatinine urine ratio -     Ozempic (0.25 or 0.5 MG/DOSE); Inject 0.25 mg into the skin once weekly for 4 weeks, then increase to 0.5 mg once weekly thereafter for diabetes.  Dispense: 9 mL; Refill: 0  Acute non-recurrent sinusitis, unspecified location Assessment & Plan: With recent urgent care and ED visits. Urgent care and ED notes and labs reviewed.  Continue Augmentin 1000-62.5 mg twice daily until complete.  Agree to provide work note to excuse her for March 20 and 21.   Elevated LFTs Assessment & Plan: Slightly elevated.  Could be secondary to fat development on the liver versus statin therapy. We discussed obtaining a liver ultrasound, she will complete this when ready.  Orders: -     US ABDOMEN LIMITED RUQ (LIVER/GB); Future        Doreene Nest, NP

## 2023-04-15 LAB — MICROALBUMIN / CREATININE URINE RATIO
Creatinine,U: 129 mg/dL
Microalb Creat Ratio: 17.7 mg/g (ref 0.0–30.0)
Microalb, Ur: 2.3 mg/dL — ABNORMAL HIGH (ref 0.0–1.9)

## 2023-04-18 ENCOUNTER — Ambulatory Visit
Admission: RE | Admit: 2023-04-18 | Discharge: 2023-04-18 | Disposition: A | Discharge status: Home / Self Care | Source: Ambulatory Visit | Attending: Primary Care | Admitting: Primary Care

## 2023-04-18 ENCOUNTER — Telehealth: Payer: Self-pay

## 2023-04-18 ENCOUNTER — Other Ambulatory Visit (HOSPITAL_COMMUNITY): Payer: Self-pay

## 2023-04-18 DIAGNOSIS — R7989 Other specified abnormal findings of blood chemistry: Secondary | ICD-10-CM | POA: Diagnosis not present

## 2023-04-18 DIAGNOSIS — Z9049 Acquired absence of other specified parts of digestive tract: Secondary | ICD-10-CM | POA: Diagnosis not present

## 2023-04-18 NOTE — Telephone Encounter (Signed)
 Pharmacy Patient Advocate Encounter   Received notification from Patient Advice Request messages that prior authorization for Ozempic (0.25 or 0.5 MG/DOSE) 2MG /3ML pen-injectors is required/requested.   Insurance verification completed.   The patient is insured through [facility] .   Per test claim: PA required and submitted KEY/EOC/Request #: [id number] APPROVED from 04/18/23 to 04/16/24. Ran test claim, Copay is $50. This test claim was processed through [facility]- copay amounts may vary at other pharmacies due to pharmacy/plan contracts, or as the patient moves through the different stages of their insurance plan.

## 2023-04-19 ENCOUNTER — Other Ambulatory Visit: Payer: Self-pay | Admitting: Primary Care

## 2023-04-19 ENCOUNTER — Inpatient Hospital Stay: Admitting: Primary Care

## 2023-04-19 DIAGNOSIS — E119 Type 2 diabetes mellitus without complications: Secondary | ICD-10-CM

## 2023-05-23 DIAGNOSIS — H6692 Otitis media, unspecified, left ear: Secondary | ICD-10-CM | POA: Diagnosis not present

## 2023-05-23 DIAGNOSIS — J0120 Acute ethmoidal sinusitis, unspecified: Secondary | ICD-10-CM | POA: Diagnosis not present

## 2023-05-30 ENCOUNTER — Other Ambulatory Visit: Payer: Self-pay | Admitting: Primary Care

## 2023-05-30 DIAGNOSIS — F411 Generalized anxiety disorder: Secondary | ICD-10-CM

## 2023-05-30 DIAGNOSIS — E559 Vitamin D deficiency, unspecified: Secondary | ICD-10-CM

## 2023-06-03 DIAGNOSIS — Z03818 Encounter for observation for suspected exposure to other biological agents ruled out: Secondary | ICD-10-CM | POA: Diagnosis not present

## 2023-06-03 DIAGNOSIS — J019 Acute sinusitis, unspecified: Secondary | ICD-10-CM | POA: Diagnosis not present

## 2023-06-03 DIAGNOSIS — J209 Acute bronchitis, unspecified: Secondary | ICD-10-CM | POA: Diagnosis not present

## 2023-06-03 DIAGNOSIS — B3731 Acute candidiasis of vulva and vagina: Secondary | ICD-10-CM | POA: Diagnosis not present

## 2023-06-03 DIAGNOSIS — J029 Acute pharyngitis, unspecified: Secondary | ICD-10-CM | POA: Diagnosis not present

## 2023-06-08 ENCOUNTER — Ambulatory Visit: Admitting: Primary Care

## 2023-06-08 ENCOUNTER — Encounter: Payer: Self-pay | Admitting: Primary Care

## 2023-06-08 VITALS — BP 132/84 | HR 91 | Temp 98.1°F | Ht 62.0 in | Wt 201.0 lb

## 2023-06-08 DIAGNOSIS — R051 Acute cough: Secondary | ICD-10-CM | POA: Insufficient documentation

## 2023-06-08 NOTE — Assessment & Plan Note (Signed)
 Symptoms could be viral, especially given no significant improvement on antibiotics.  Discussed this today.  For now, we will continue doxycycline 100 mg twice daily until complete. Stop Sudafed, discussed the risks for rebound symptoms.  Start Zyrtec 10 mg at bedtime. We discussed use of Flonase nasal spray. Continue meclizine DM cough syrup as needed.  Work note provided to excuse her May 12 through 34 with a return to work date of May 19.

## 2023-06-08 NOTE — Progress Notes (Signed)
 Subjective:    Patient ID: Kayla Robles, female    DOB: 1970-12-25, 53 y.o.   MRN: [id number]  Cough Associated symptoms include chills, headaches and postnasal drip. Pertinent negatives include no sore throat or shortness of breath.    Kayla Robles is a very pleasant 53 y.o. female with a history of type 2 diabetes, hypertension, acute sinusitis, gout, anxiety disorder, hyperlipidemia who presents today to discuss URI symptoms.  Evaluated on 06/03/2023 at [facility] urgent care for a 2-day history of sore throat, sinus congestion, chest congestion, body aches, chills, decreased appetite.  Her husband had similar symptoms the week prior.  During her urgent care visit she was diagnosed with acute sinusitis and prescribed doxycycline 100 mg twice daily x 10 days, prednisone  10 mg twice daily x 5 days, Promethazine DM cough syrup, fluconazole  150 mg tablet.  She tested negative for RSV, influenza AMB, COVID-19 infections.  Today she discusses that she felt improved yesterday and planned to return to work today, but on her way to work she began coughing up brown sputum. She worked for about 1 hour but had to leave due to her cough. She came home and slept for several hours. Other symptoms include sneezing, fatigue, felt chills, low grade fever of 99.0. She's taken Tylenol, Sudafed, promethazine cough syrup.  She continues to notice a cough, head congestion, and fatigue. She is compliant to her doxycycline, has 5 days remaining.  Prednisone  has caused insomnia, she is taking 10 mg twice daily.  She would like to be written out of work all week due to her symptoms.   Review of Systems  Constitutional:  Positive for chills and fatigue.  HENT:  Positive for congestion and postnasal drip. Negative for sore throat.   Respiratory:  Positive for cough. Negative for chest tightness and shortness of breath.   Neurological:  Positive for headaches.         Past Medical History:   Diagnosis Date   Anxiety    Hypertension    Kidney stones    S/P cholecystectomy t   S/P tubal ligation 1992    Social History   Socioeconomic History   Marital status: Married    Spouse name: Not on file   Number of children: Not on file   Years of education: Not on file   Highest education level: 12th grade  Occupational History   Not on file  Tobacco Use   Smoking status: Never   Smokeless tobacco: Never  Substance and Sexual Activity   Alcohol use: No   Drug use: No   Sexual activity: Not on file  Other Topics Concern   Not on file  Social History Narrative   Married.   Works as a Air traffic controller.   Enjoys playing with her grandchildren.   Social Drivers of Corporate investment banker Strain: Low Risk  (06/08/2023)   Overall Financial Resource Strain (CARDIA)    Difficulty of Paying Living Expenses: Not hard at all  Food Insecurity: No Food Insecurity (06/08/2023)   Hunger Vital Sign    Worried About Running Out of Food in the Last Year: Never true    Ran Out of Food in the Last Year: Never true  Transportation Needs: No Transportation Needs (06/08/2023)   PRAPARE - Administrator, Civil Service (Medical): No    Lack of Transportation (Non-Medical): No  Physical Activity: Sufficiently Active (06/08/2023)   Exercise Vital Sign    Days of Exercise  per Week: 5 days    Minutes of Exercise per Session: 150+ min  Stress: No Stress Concern Present (06/08/2023)   [employer] of Occupational Health - Occupational Stress Questionnaire    Feeling of Stress : Not at all  Social Connections: Moderately Integrated (06/08/2023)   Social Connection and Isolation Panel [NHANES]    Frequency of Communication with Friends and Family: More than three times a week    Frequency of Social Gatherings with Friends and Family: Once a week    Attends Religious Services: More than 4 times per year    Active Member of [employer] or Organizations: No    Attends Museum/gallery exhibitions officer: Not on file    Marital Status: Married  Catering manager Violence: Not on file    Past Surgical History:  Procedure Laterality Date   CHOLECYSTECTOMY     LITHOTRIPSY      Family History  Problem Relation Age of Onset   Arthritis Mother    Heart disease Mother    Hypertension Mother    Hypertension Father    Diabetes Father     Allergies  Allergen Reactions   Levaquin [Levofloxacin] Anaphylaxis    "felt weird" felt worse than usually felt   Zithromax [Azithromycin] Anaphylaxis   Other Other (See Comments)    Most anxiety medications   Paroxetine Hcl Other (See Comments)    suicidal    Current Outpatient Medications on File Prior to Visit  Medication Sig Dispense Refill   gabapentin  (NEURONTIN ) 100 MG capsule Take 1 capsule (100 mg total) by mouth at bedtime. For pain and sleep 90 capsule 1   lisinopril  (ZESTRIL ) 20 MG tablet TAKE 1 TABLET DAILY FOR BLOOD PRESSURE 90 tablet 3   metFORMIN  (GLUCOPHAGE ) 500 MG tablet TAKE 3 TABLETS DAILY WITH BREAKFAST FOR DIABETES 270 tablet 1   Semaglutide ,0.25 or 0.5MG /DOS, (OZEMPIC , 0.25 OR 0.5 MG/DOSE,) 2 MG/3ML SOPN Inject 0.25 mg into the skin once weekly for 4 weeks, then increase to 0.5 mg once weekly thereafter for diabetes. 9 mL 0   venlafaxine  XR (EFFEXOR -XR) 150 MG 24 hr capsule TAKE 1 CAPSULE DAILY WITH BREAKFAST FOR ANXIETY 90 capsule 0   Vitamin D , Ergocalciferol , (DRISDOL ) 1.25 MG (50000 UNIT) CAPS capsule TAKE 1 CAPSULE EVERY 7 DAYS 12 capsule 2   busPIRone  (BUSPAR ) 5 MG tablet Take 1 tablet (5 mg total) by mouth 2 (two) times daily. For anxiety (Patient not taking: Reported on 06/08/2023) 180 tablet 3   hydrOXYzine  (ATARAX ) 25 MG tablet TAKE 1 TABLET AT BEDTIME AS NEEDED FOR ANXIETY AND SLEEP (Patient not taking: Reported on 06/08/2023) 90 tablet 0   pravastatin  (PRAVACHOL ) 40 MG tablet Take 1 tablet (40 mg total) by mouth daily. for cholesterol. (Patient not taking: Reported on 04/14/2023) 90 tablet 2   No  current facility-administered medications on file prior to visit.    BP 132/84   Pulse 91   Temp 98.1 F (36.7 C) (Oral)   Ht 5\' 2"  (1.575 m)   Wt 201 lb (91.2 kg)   LMP 03/12/2016   SpO2 96%   BMI 36.76 kg/m  Objective:   Physical Exam Constitutional:      Appearance: She is ill-appearing.  HENT:     Right Ear: Tympanic membrane and ear canal normal.     Left Ear: Tympanic membrane and ear canal normal.     Nose: No mucosal edema.     Right Sinus: No maxillary sinus tenderness or frontal sinus tenderness.  Left Sinus: No maxillary sinus tenderness or frontal sinus tenderness.     Mouth/Throat:     Mouth: Mucous membranes are moist.  Eyes:     Conjunctiva/sclera: Conjunctivae normal.  Cardiovascular:     Rate and Rhythm: Normal rate and regular rhythm.  Pulmonary:     Effort: Pulmonary effort is normal.     Breath sounds: Normal breath sounds. No wheezing or rhonchi.     Comments: No cough noted during visit Musculoskeletal:     Cervical back: Neck supple.  Skin:    General: Skin is warm and dry.           Assessment & Plan:  Acute cough Assessment & Plan: Symptoms could be viral, especially given no significant improvement on antibiotics.  Discussed this today.  For now, we will continue doxycycline 100 mg twice daily until complete. Stop Sudafed, discussed the risks for rebound symptoms.  Start Zyrtec 10 mg at bedtime. We discussed use of Flonase nasal spray. Continue meclizine DM cough syrup as needed.  Work note provided to excuse her May 12 through 28 with a return to work date of May 19.         Alfie Alderfer K Doneta Bayman, NP

## 2023-06-08 NOTE — Patient Instructions (Signed)
 Continue the doxycycline antibiotic pills until they are complete.  Start Zyrtec 10 mg at bedtime.  Try Flonase nasal spray as discussed.  Stop taking Sudafed.  It was a pleasure to see you today!

## 2023-06-27 ENCOUNTER — Other Ambulatory Visit: Payer: Self-pay | Admitting: Primary Care

## 2023-06-27 DIAGNOSIS — I10 Essential (primary) hypertension: Secondary | ICD-10-CM

## 2023-06-29 ENCOUNTER — Other Ambulatory Visit: Payer: Self-pay | Admitting: Primary Care

## 2023-06-29 DIAGNOSIS — E1165 Type 2 diabetes mellitus with hyperglycemia: Secondary | ICD-10-CM

## 2023-07-06 ENCOUNTER — Encounter: Payer: BC Managed Care – PPO | Admitting: Primary Care

## 2023-07-12 ENCOUNTER — Telehealth: Payer: Self-pay

## 2023-07-12 NOTE — Telephone Encounter (Signed)
 Received FMLA forms for extension. Left message to return call to office. Need to follow up regarding FMLA/disability forms.

## 2023-07-14 NOTE — Telephone Encounter (Signed)
 Copied from CRM [id number]. Topic: General - Other >> Jul 13, 2023  5:49 PM [place] J wrote: Reason for CRM: Patient returning a phone call regarding her FMLA paperwork. I let her know that someone from radiology reached out and instructed her to call the office back.   Please call patient back.

## 2023-07-14 NOTE — Telephone Encounter (Signed)
 Spoke with patient regarding FMLA extension forms. Updated dates as requested per patient. Forms placed in providers box for review.

## 2023-07-14 NOTE — Telephone Encounter (Signed)
 Left second message to return call to office. Need to follow up regarding FMLA/disability forms.

## 2023-07-14 NOTE — Telephone Encounter (Signed)
 Please notify patient that we received her FMLA forms and will update during her upcoming visit.

## 2023-07-15 NOTE — Telephone Encounter (Signed)
 Left voicemail per DPR advising patient of Onalee Bien message.

## 2023-07-18 NOTE — Telephone Encounter (Signed)
 Yes, I have her paperwork. Will discuss at upcoming visit.

## 2023-07-18 NOTE — Telephone Encounter (Signed)
 Burnard, do you still have her FMLA folder?  Let me know if there is anything I need to do on my end. Thanks.

## 2023-07-18 NOTE — Telephone Encounter (Signed)
 Sounds good. Thank you for letting me know.

## 2023-07-22 ENCOUNTER — Encounter: Payer: Self-pay | Admitting: Primary Care

## 2023-07-22 ENCOUNTER — Ambulatory Visit: Payer: Self-pay | Admitting: Primary Care

## 2023-07-22 ENCOUNTER — Telehealth: Payer: Self-pay | Admitting: Primary Care

## 2023-07-22 ENCOUNTER — Ambulatory Visit (INDEPENDENT_AMBULATORY_CARE_PROVIDER_SITE_OTHER): Admitting: Primary Care

## 2023-07-22 ENCOUNTER — Other Ambulatory Visit: Payer: Self-pay | Admitting: Primary Care

## 2023-07-22 VITALS — BP 134/86 | HR 85 | Temp 97.3°F | Ht 62.0 in | Wt 198.0 lb

## 2023-07-22 DIAGNOSIS — I10 Essential (primary) hypertension: Secondary | ICD-10-CM | POA: Diagnosis not present

## 2023-07-22 DIAGNOSIS — Z0000 Encounter for general adult medical examination without abnormal findings: Secondary | ICD-10-CM | POA: Diagnosis not present

## 2023-07-22 DIAGNOSIS — E785 Hyperlipidemia, unspecified: Secondary | ICD-10-CM | POA: Diagnosis not present

## 2023-07-22 DIAGNOSIS — E1165 Type 2 diabetes mellitus with hyperglycemia: Secondary | ICD-10-CM | POA: Diagnosis not present

## 2023-07-22 DIAGNOSIS — G6289 Other specified polyneuropathies: Secondary | ICD-10-CM

## 2023-07-22 DIAGNOSIS — E559 Vitamin D deficiency, unspecified: Secondary | ICD-10-CM

## 2023-07-22 DIAGNOSIS — Z0279 Encounter for issue of other medical certificate: Secondary | ICD-10-CM

## 2023-07-22 DIAGNOSIS — F411 Generalized anxiety disorder: Secondary | ICD-10-CM

## 2023-07-22 DIAGNOSIS — F321 Major depressive disorder, single episode, moderate: Secondary | ICD-10-CM

## 2023-07-22 DIAGNOSIS — Z7984 Long term (current) use of oral hypoglycemic drugs: Secondary | ICD-10-CM

## 2023-07-22 DIAGNOSIS — Z7985 Long-term (current) use of injectable non-insulin antidiabetic drugs: Secondary | ICD-10-CM

## 2023-07-22 LAB — LIPID PANEL
Cholesterol: 209 mg/dL — ABNORMAL HIGH (ref 0–200)
HDL: 33.6 mg/dL — ABNORMAL LOW (ref >39.00)
LDL Cholesterol: 111 mg/dL — ABNORMAL HIGH (ref 0–99)
NonHDL: 175.61
Total CHOL/HDL Ratio: 6
Triglycerides: 321 mg/dL — ABNORMAL HIGH (ref 0.0–149.0)
VLDL: 64.2 mg/dL — ABNORMAL HIGH (ref 0.0–40.0)

## 2023-07-22 LAB — POCT GLYCOSYLATED HEMOGLOBIN (HGB A1C): Hemoglobin A1C: 6.3 % — AB (ref 4.0–5.6)

## 2023-07-22 LAB — VITAMIN D 25 HYDROXY (VIT D DEFICIENCY, FRACTURES): VITD: 18.85 ng/mL — ABNORMAL LOW (ref 30.00–100.00)

## 2023-07-22 MED ORDER — VITAMIN D (ERGOCALCIFEROL) 1.25 MG (50000 UNIT) PO CAPS
50000.0000 [IU] | ORAL_CAPSULE | ORAL | 0 refills | Status: DC
Start: 2023-07-22 — End: 2023-07-27

## 2023-07-22 NOTE — Assessment & Plan Note (Addendum)
 Stable.  Continue venlafaxine  ER 150 mg daily. Agreed to update/renew FMLA paperwork today.

## 2023-07-22 NOTE — Assessment & Plan Note (Signed)
 Declines shingles vaccine.  She will contact [facility] regarding the pneumonia vaccine Pap smear overdue, patient declines despite recommendations Mammogram declined Colon cancer screening up-to-date, due 2027  Discussed the importance of a healthy diet and regular exercise in order for weight loss, and to reduce the risk of further co-morbidity.  Exam stable. Labs pending.  Follow up in 1 year for repeat physical.

## 2023-07-22 NOTE — Telephone Encounter (Signed)
 Copied from CRM [id number]. Topic: General - Other >> Jul 22, 2023  8:43 AM Thersia BROCKS wrote: Reason for CRM: Patient called in regarding needing an doctors note for todays appointment, would like for it to be sent to her mychart as soon as possible

## 2023-07-22 NOTE — Assessment & Plan Note (Signed)
 Waxes and wanes.  Continue gabapentin  100 mg HS PRN.

## 2023-07-22 NOTE — Progress Notes (Signed)
 Subjective:    Patient ID: Kayla Robles, female    DOB: [birth year], 53 y.o.   MRN: [id number]  HPI  Kayla Robles is a very pleasant 53 y.o. female who presents today for complete physical and follow up of chronic conditions.  She is also needing her FMLA paperwork renewed. She is needing intermittent FMLA for anxiety and depression. Some days she feels like she cannot get out of bed due to her overwhelming anxiety. These episodes occur a few times annually with big events. She is managed on venlafaxine  ER 150 mg daily and hydroxyzine  PRN which have helped overall. She never started buspirone .   She would also like to discuss low libido and dyspareunia. Chronic for several years. Her last menstrual cycles was about 6 years ago. She has tried lubricants OTC, not tried vaginal moisturizers. She denies symptoms of mood swings and hot flashes.   Immunizations: -Tetanus: Completed in 2019  -Shingles: Never completed, declines -Pneumonia: [facility]  Diet: Fair diet.  Exercise: No regular exercise.  Eye exam: Completes annually  Dental exam: Completes semi-annually    Pap Smear: Completed in 2020, declined last year and declines this year. Mammogram: Completed in June 2024, declines today  Colonoscopy: Completed Cologuard in 2024, negative result  BP Readings from Last 3 Encounters:  07/22/23 134/86  06/08/23 132/84  04/14/23 (!) 148/84    Wt Readings from Last 3 Encounters:  07/22/23 198 lb (89.8 kg)  06/08/23 201 lb (91.2 kg)  04/14/23 208 lb (94.3 kg)        Review of Systems  Constitutional:  Negative for unexpected weight change.  HENT:  Negative for rhinorrhea.   Respiratory:  Negative for cough and shortness of breath.   Cardiovascular:  Negative for chest pain.  Gastrointestinal:  Negative for constipation and diarrhea.  Genitourinary:  Negative for difficulty urinating.  Musculoskeletal:  Negative for arthralgias and  myalgias.  Skin:  Negative for rash.  Allergic/Immunologic: Negative for environmental allergies.  Neurological:  Negative for dizziness, numbness and headaches.  Psychiatric/Behavioral:  The patient is nervous/anxious.          Past Medical History:  Diagnosis Date   Acute gout involving toe of right foot 08/10/2022   Anxiety    Hypertension    Kidney stones    S/P cholecystectomy t   S/P tubal ligation 01/25/1990    Social History   Socioeconomic History   Marital status: Married    Spouse name: Not on file   Number of children: Not on file   Years of education: Not on file   Highest education level: 12th grade  Occupational History   Not on file  Tobacco Use   Smoking status: Never   Smokeless tobacco: Never  Substance and Sexual Activity   Alcohol use: No   Drug use: No   Sexual activity: Not on file  Other Topics Concern   Not on file  Social History Narrative   Married.   Works as a Air traffic controller.   Enjoys playing with her grandchildren.   Social Drivers of Corporate investment banker Strain: Low Risk  (07/18/2023)   Overall Financial Resource Strain (CARDIA)    Difficulty of Paying Living Expenses: Not very hard  Food Insecurity: No Food Insecurity (07/18/2023)   Hunger Vital Sign    Worried About Running Out of Food in the Last Year: Never true    Ran Out of Food in the  Last Year: Never true  Transportation Needs: No Transportation Needs (07/18/2023)   PRAPARE - Administrator, Civil Service (Medical): No    Lack of Transportation (Non-Medical): No  Physical Activity: Unknown (07/18/2023)   Exercise Vital Sign    Days of Exercise per Week: Patient declined    Minutes of Exercise per Session: Not on file  Stress: Stress Concern Present (07/18/2023)   [employer] of Occupational Health - Occupational Stress Questionnaire    Feeling of Stress: Rather much  Social Connections: Moderately Integrated (07/18/2023)   Social Connection and  Isolation Panel    Frequency of Communication with Friends and Family: More than three times a week    Frequency of Social Gatherings with Friends and Family: Once a week    Attends Religious Services: More than 4 times per year    Active Member of [employer] or Organizations: No    Attends Engineer, structural: Not on file    Marital Status: Married  Catering manager Violence: Not on file    Past Surgical History:  Procedure Laterality Date   CHOLECYSTECTOMY     LITHOTRIPSY      Family History  Problem Relation Age of Onset   Arthritis Mother    Heart disease Mother    Hypertension Mother    Hypertension Father    Diabetes Father     Allergies  Allergen Reactions   Levaquin [Levofloxacin] Anaphylaxis    felt weird felt worse than usually felt   Zithromax [Azithromycin] Anaphylaxis   Other Other (See Comments)    Most anxiety medications   Paroxetine Hcl Other (See Comments)    suicidal    Current Outpatient Medications on File Prior to Visit  Medication Sig Dispense Refill   gabapentin  (NEURONTIN ) 100 MG capsule Take 1 capsule (100 mg total) by mouth at bedtime. For pain and sleep 90 capsule 1   lisinopril  (ZESTRIL ) 20 MG tablet TAKE 1 TABLET DAILY FOR BLOOD PRESSURE 90 tablet 0   metFORMIN  (GLUCOPHAGE ) 500 MG tablet TAKE 3 TABLETS DAILY WITH BREAKFAST FOR DIABETES 270 tablet 1   Semaglutide ,0.25 or 0.5MG /DOS, (OZEMPIC , 0.25 OR 0.5 MG/DOSE,) 2 MG/3ML SOPN Inject 0.5 mg into the skin once a week. for diabetes. 9 mL 0   venlafaxine  XR (EFFEXOR -XR) 150 MG 24 hr capsule TAKE 1 CAPSULE DAILY WITH BREAKFAST FOR ANXIETY 90 capsule 0   hydrOXYzine  (ATARAX ) 25 MG tablet TAKE 1 TABLET AT BEDTIME AS NEEDED FOR ANXIETY AND SLEEP (Patient not taking: Reported on 07/22/2023) 90 tablet 0   pravastatin  (PRAVACHOL ) 40 MG tablet Take 1 tablet (40 mg total) by mouth daily. for cholesterol. (Patient not taking: Reported on 07/22/2023) 90 tablet 2   Vitamin D , Ergocalciferol , (DRISDOL )  1.25 MG (50000 UNIT) CAPS capsule TAKE 1 CAPSULE EVERY 7 DAYS (Patient not taking: Reported on 07/22/2023) 12 capsule 2   No current facility-administered medications on file prior to visit.    BP 134/86   Pulse 85   Temp (!) 97.3 F (36.3 C) (Temporal)   Ht 5' 2 (1.575 m)   Wt 198 lb (89.8 kg)   LMP 03/12/2016   SpO2 96%   BMI 36.21 kg/m  Objective:   Physical Exam HENT:     Right Ear: Tympanic membrane and ear canal normal.     Left Ear: Tympanic membrane and ear canal normal.   Eyes:     Pupils: Pupils are equal, round, and reactive to light.    Cardiovascular:  Rate and Rhythm: Normal rate and regular rhythm.  Pulmonary:     Effort: Pulmonary effort is normal.     Breath sounds: Normal breath sounds.  Abdominal:     General: Bowel sounds are normal.     Palpations: Abdomen is soft.     Tenderness: There is no abdominal tenderness.   Musculoskeletal:        General: Normal range of motion.     Cervical back: Neck supple.   Skin:    General: Skin is warm and dry.   Neurological:     Mental Status: She is alert and oriented to person, place, and time.     Cranial Nerves: No cranial nerve deficit.     Deep Tendon Reflexes:     Reflex Scores:      Patellar reflexes are 2+ on the right side and 2+ on the left side.  Psychiatric:        Mood and Affect: Mood normal.           Assessment & Plan:  Preventative health care Assessment & Plan: Declines shingles vaccine.  [facility] Pap smear overdue, patient declines despite recommendations Mammogram declined Colon cancer screening up-to-date, due 2027  Discussed the importance of a healthy diet and regular exercise in order for weight loss, and to reduce the risk of further co-morbidity.  Exam stable. Labs pending.  Follow up in 1 year for repeat physical.    Type 2 diabetes mellitus with hyperglycemia, without long-term current use of insulin  (HCC) Assessment & Plan: Improved with A1C of 6.3!!   Continue metformin  ER 1500 mg daily. Continue Ozempic  0.5 mg weekly. She will try pravastatin  again.  Follow up in 6 months.   Orders: -     POCT glycosylated hemoglobin (Hb A1C)  Essential hypertension Assessment & Plan: Stable.   Continue lisinopril  20 mg daily. CMP reviewed from March 2025.    Other polyneuropathy Assessment & Plan: Waxes and wanes.  Continue gabapentin  100 mg HS PRN.   Current moderate episode of major depressive disorder without prior episode [facility]) Assessment & Plan: Stable.  Continue venlafaxine  ER 150 mg daily.   Generalized anxiety disorder Assessment & Plan: Stable.  Continue venlafaxine  ER 150 mg daily.   Hyperlipidemia, unspecified hyperlipidemia type Assessment & Plan: Intolerant to multiple statins.  Lipid panel pending.  She would like to try pravastatin  again.  She has plenty of pills at home She will update.  Orders: -     Lipid panel  Vitamin D  deficiency Assessment & Plan: Not currently on supplementation. Repeat vitamin D  level pending.  Orders: -     VITAMIN D  25 Hydroxy (Vit-D Deficiency, Fractures)        Comer MARLA Gaskins, NP

## 2023-07-22 NOTE — Patient Instructions (Signed)
 Stop by the lab prior to leaving today. I will notify you of your results once received.   Resume the pravastatin  cholesterol pill  Please schedule a follow up visit for 6 months for a diabetes check.  It was a pleasure to see you today!

## 2023-07-22 NOTE — Assessment & Plan Note (Addendum)
 Improved with A1C of 6.3!!   Continue metformin  ER 1500 mg daily. Continue Ozempic  0.5 mg weekly. She will try pravastatin  again.  Follow up in 6 months.

## 2023-07-22 NOTE — Assessment & Plan Note (Signed)
Not currently on supplementation.  Repeat vitamin D level pending.

## 2023-07-22 NOTE — Assessment & Plan Note (Addendum)
 Stable.  Continue venlafaxine  ER 150 mg daily.  Agreed to [employer] paperwork.

## 2023-07-22 NOTE — Telephone Encounter (Signed)
 Received and faxed completed forms to [phone] Copy sent to scan Reached out to pt regarding picking up a copy of the forms for their records or mailing them to address on file.

## 2023-07-22 NOTE — Telephone Encounter (Signed)
 Work note posted to [employer].  FMLA paper work completed and placed on [employer].

## 2023-07-22 NOTE — Assessment & Plan Note (Signed)
 Stable.   Continue lisinopril  20 mg daily. CMP reviewed from March 2025.

## 2023-07-22 NOTE — Assessment & Plan Note (Addendum)
 Intolerant to multiple statins.  Lipid panel pending.  She would like to try pravastatin  again.  She has plenty of pills at home She will update.

## 2023-07-25 ENCOUNTER — Other Ambulatory Visit: Payer: Self-pay

## 2023-07-25 ENCOUNTER — Other Ambulatory Visit (HOSPITAL_COMMUNITY): Payer: Self-pay

## 2023-07-27 DIAGNOSIS — E559 Vitamin D deficiency, unspecified: Secondary | ICD-10-CM

## 2023-07-27 MED ORDER — VITAMIN D (ERGOCALCIFEROL) 1.25 MG (50000 UNIT) PO CAPS
50000.0000 [IU] | ORAL_CAPSULE | ORAL | 0 refills | Status: DC
Start: 2023-07-27 — End: 2023-10-09

## 2023-07-27 NOTE — Addendum Note (Signed)
 Addended by: Anneth Brunell K on: 07/27/2023 04:11 PM   Modules accepted: Orders

## 2023-07-27 NOTE — Telephone Encounter (Deleted)
 SABRA

## 2023-07-27 NOTE — Addendum Note (Signed)
 Addended by: SEBASTIAN DANNA GRADE on: 07/27/2023 02:34 PM   Modules accepted: Orders

## 2023-08-12 ENCOUNTER — Encounter: Payer: Self-pay | Admitting: Advanced Practice Midwife

## 2023-08-24 ENCOUNTER — Other Ambulatory Visit: Payer: Self-pay | Admitting: Primary Care

## 2023-08-24 DIAGNOSIS — G8929 Other chronic pain: Secondary | ICD-10-CM

## 2023-08-29 ENCOUNTER — Other Ambulatory Visit: Payer: Self-pay | Admitting: Primary Care

## 2023-08-29 DIAGNOSIS — F411 Generalized anxiety disorder: Secondary | ICD-10-CM

## 2023-09-02 DIAGNOSIS — E1165 Type 2 diabetes mellitus with hyperglycemia: Secondary | ICD-10-CM

## 2023-09-02 MED ORDER — OZEMPIC (0.25 OR 0.5 MG/DOSE) 2 MG/3ML ~~LOC~~ SOPN: 0.5000 mg | PEN_INJECTOR | SUBCUTANEOUS | 1 refills | Status: DC

## 2023-09-26 ENCOUNTER — Other Ambulatory Visit: Payer: Self-pay | Admitting: Primary Care

## 2023-09-26 DIAGNOSIS — I10 Essential (primary) hypertension: Secondary | ICD-10-CM

## 2023-10-07 ENCOUNTER — Other Ambulatory Visit (INDEPENDENT_AMBULATORY_CARE_PROVIDER_SITE_OTHER)

## 2023-10-07 DIAGNOSIS — E559 Vitamin D deficiency, unspecified: Secondary | ICD-10-CM

## 2023-10-08 LAB — VITAMIN D 25 HYDROXY (VIT D DEFICIENCY, FRACTURES): Vit D, 25-Hydroxy: 63 ng/mL (ref 30–100)

## 2023-10-09 ENCOUNTER — Ambulatory Visit: Payer: Self-pay | Admitting: Primary Care

## 2023-10-17 ENCOUNTER — Other Ambulatory Visit: Payer: Self-pay | Admitting: Primary Care

## 2023-10-17 DIAGNOSIS — E119 Type 2 diabetes mellitus without complications: Secondary | ICD-10-CM

## 2023-12-12 ENCOUNTER — Other Ambulatory Visit: Payer: Self-pay

## 2023-12-12 ENCOUNTER — Emergency Department (HOSPITAL_COMMUNITY)

## 2023-12-12 ENCOUNTER — Encounter (HOSPITAL_COMMUNITY): Payer: Self-pay

## 2023-12-12 ENCOUNTER — Ambulatory Visit: Payer: Self-pay

## 2023-12-12 ENCOUNTER — Emergency Department (HOSPITAL_COMMUNITY)
Admission: EM | Admit: 2023-12-12 | Discharge: 2023-12-13 | Disposition: A | Attending: Emergency Medicine | Admitting: Emergency Medicine

## 2023-12-12 DIAGNOSIS — Z7984 Long term (current) use of oral hypoglycemic drugs: Secondary | ICD-10-CM | POA: Insufficient documentation

## 2023-12-12 DIAGNOSIS — Z79899 Other long term (current) drug therapy: Secondary | ICD-10-CM | POA: Insufficient documentation

## 2023-12-12 DIAGNOSIS — E119 Type 2 diabetes mellitus without complications: Secondary | ICD-10-CM | POA: Insufficient documentation

## 2023-12-12 DIAGNOSIS — R079 Chest pain, unspecified: Secondary | ICD-10-CM | POA: Diagnosis not present

## 2023-12-12 DIAGNOSIS — Z0389 Encounter for observation for other suspected diseases and conditions ruled out: Secondary | ICD-10-CM | POA: Diagnosis not present

## 2023-12-12 DIAGNOSIS — R0789 Other chest pain: Secondary | ICD-10-CM | POA: Diagnosis not present

## 2023-12-12 DIAGNOSIS — I10 Essential (primary) hypertension: Secondary | ICD-10-CM | POA: Insufficient documentation

## 2023-12-12 LAB — COMPREHENSIVE METABOLIC PANEL WITH GFR
ALT: 31 U/L (ref 0–44)
AST: 29 U/L (ref 15–41)
Albumin: 4.1 g/dL (ref 3.5–5.0)
Alkaline Phosphatase: 59 U/L (ref 38–126)
Anion gap: 14 (ref 5–15)
BUN: 10 mg/dL (ref 6–20)
CO2: 23 mmol/L (ref 22–32)
Calcium: 10.4 mg/dL — ABNORMAL HIGH (ref 8.9–10.3)
Chloride: 104 mmol/L (ref 98–111)
Creatinine, Ser: 0.71 mg/dL (ref 0.44–1.00)
GFR, Estimated: >60 mL/min (ref >60)
Glucose, Bld: 109 mg/dL — ABNORMAL HIGH (ref 70–99)
Potassium: 4.1 mmol/L (ref 3.5–5.1)
Sodium: 141 mmol/L (ref 135–145)
Total Bilirubin: 0.8 mg/dL (ref 0.0–1.2)
Total Protein: 7.7 g/dL (ref 6.5–8.1)

## 2023-12-12 LAB — CBC
HCT: 39.3 % (ref 36.0–46.0)
Hemoglobin: 13.7 g/dL (ref 12.0–15.0)
MCH: 30.6 pg (ref 26.0–34.0)
MCHC: 34.9 g/dL (ref 30.0–36.0)
MCV: 87.9 fL (ref 80.0–100.0)
Platelets: 289 K/uL (ref 150–400)
RBC: 4.47 MIL/uL (ref 3.87–5.11)
RDW: 12 % (ref 11.5–15.5)
WBC: 9.1 K/uL (ref 4.0–10.5)
nRBC: 0 % (ref 0.0–0.2)

## 2023-12-12 LAB — LIPASE, BLOOD: Lipase: 33 U/L (ref 11–51)

## 2023-12-12 LAB — TROPONIN I (HIGH SENSITIVITY)
Troponin I (High Sensitivity): 3 ng/L (ref <18)
Troponin I (High Sensitivity): <2 ng/L (ref <18)

## 2023-12-12 LAB — D-DIMER, QUANTITATIVE: D-Dimer, Quant: <0.27 ug{FEU}/mL (ref 0.00–0.50)

## 2023-12-12 MED ORDER — IOHEXOL 350 MG/ML SOLN
100.0000 mL | Freq: Once | INTRAVENOUS | Status: AC | PRN
  Administered 2023-12-12: 100 mL via INTRAVENOUS

## 2023-12-12 NOTE — ED Notes (Signed)
 Extra SST & DG tube drawn

## 2023-12-12 NOTE — Telephone Encounter (Signed)
 Noted, will await ED notes.

## 2023-12-12 NOTE — ED Provider Triage Note (Signed)
 Emergency Medicine Provider Triage Evaluation Note  Kayla Robles , a 53 y.o. female  was evaluated in triage.  Pt complains of centralized chest pain, radiates to the back.  Has been present over the last 8 to 10 weeks however was referred here by her PCP secondary to concerns for possible cardiac etiology.  Review of Systems  Positive: As above Negative:   Physical Exam  BP (!) 151/83 (BP Location: Left Arm)   Pulse 86   Temp 97.8 F (36.6 C)   Resp 17   LMP 03/12/2016   SpO2 94%  Gen:   Awake, no distress   Resp:  Normal effort  MSK:   Moves extremities without difficulty  Other:  Normal cardiac and pulmonary auscultation  Medical Decision Making  Medically screening exam initiated at 2:37 PM.  Appropriate orders placed.  Kayla Robles was informed that the remainder of the evaluation will be completed by another provider, this initial triage assessment does not replace that evaluation, and the importance of remaining in the ED until their evaluation is complete.  Initial order set and imaging orders placed.   Kayla Robles, [place] 12/12/23 1447

## 2023-12-12 NOTE — ED Notes (Signed)
 Pt denies chest pain at this time. Reports it comes and goes. States when she has CP it feels like a burning sensation. Also c/o bilateral foot swelling. States her s/s began after uping her dose of ozempic . States she also had been getting headaches. States she doesn't eat very much anymore.

## 2023-12-12 NOTE — Telephone Encounter (Signed)
 FYI Only or Action Required?: FYI only for provider: ED advised.  Patient was last seen in primary care on 07/22/2023 by Kayla Comer POUR, NP.  Called Nurse Triage reporting Chest Pain and Anxiety.  Symptoms began a week ago.  Interventions attempted: Rest, hydration, or home remedies.  Symptoms are: gradually worsening.  Triage Disposition: No disposition on file.  Patient/caregiver understands and will follow disposition?:   Copied from CRM #[id number]. Topic: Clinical - Red Word Triage >> Dec 12, 2023  9:53 AM Mesmerise C wrote: [facility] that prompted transfer to Nurse Triage: Patient sufferes from chronic anxiety, stress and depression doesn't know if it's her heart states over the weekend one foot swelled up Friday went down Saturday same thing, today looks to be the same hasn't been able to sleep as well, states her daughter was diagnosed with cancer and doesn't know if it it's the stress that's getting to her, had a burning in her chest, feeling sluggish and tired, pain in her chest >> Dec 12, 2023 10:09 AM Mesmerise C wrote: Patient couldn't wait on hold for NT was at work and had to go, sent message to NT to advise of a call back Reason for Disposition  [1] Chest pain (or angina) comes and goes AND [2] is happening more often (increasing in frequency) or getting worse (increasing in severity)  (Exception: Chest pains that last only a few seconds.)  Answer Assessment - Initial Assessment Questions Patient with multiple concerning symptoms off and on for weeks. Currently having chest pressure and burning in the middle of her chest but starting to go to the right side more, radiates to the back. Heart fluttering. Burning more than heartburn and sustained after antacids. Right arm pain intermittent. Exhaustion -Having what she thinks are bouts of Gout- left foot with spots swollen and painful one day and then moved to the right foot the next day. Hard to stand.  -Mild Headache for  weeks now -left Jaw and neck pain off and on- 8-10weeks now - felt like a ball in the back of her throat.  -Sick on stomach off and on -Dizziness off and on- -Denies SOB, Sweating-- on the cold side the last few months  L-eft pins and needles sensation last week- blurry vision at the same time BP this weekend 150/90- usually runs 120/78 Advised that with the multitude of symptoms that need testing, ED would be the best to assess for any Clots. Advised she is having TIA and cardiac symptoms concerning for blood clots. Pt will have husband drive her to the ED. Will call EMS if unable to  1. LOCATION: Where does it hurt?      Center chest pressure migrating to the right now 2. RADIATION: Does the pain go anywhere else? (e.g., into neck, jaw, arms, back)     To her back  3. ONSET: When did the chest pain begin? (Minutes, hours or days)      Off and on- this week the worst 4. PATTERN: Does the pain come and go, or has it been constant since it started?  Does it get worse with exertion?      Fluctuates in severity 5. DURATION: How long does it last (e.g., seconds, minutes, hours)     constant 6. SEVERITY: How bad is the pain?  (e.g., Scale 1-10; mild, moderate, or severe)     Moderate pressure and burning 7. CARDIAC RISK FACTORS: Do you have any history of heart problems or risk factors for heart  disease? (e.g., angina, prior heart attack; diabetes, high blood pressure, high cholesterol, smoker, or strong family history of heart disease)     HTN  8. PULMONARY RISK FACTORS: Do you have any history of lung disease?  (e.g., blood clots in lung, asthma, emphysema, birth control pills)     allergies 9. CAUSE: What do you think is causing the chest pain?     unsure 10. OTHER SYMPTOMS: Do you have any other symptoms? (e.g., dizziness, nausea, vomiting, sweating, fever, difficulty breathing, cough)       See above- MANY  Protocols used: Chest Pain-A-AH

## 2023-12-12 NOTE — ED Triage Notes (Signed)
 Pt c/o intermittent chest pain that has been ongoing for weeks. Reports chest pain radiates to her back, has also had some swelling in her LE.

## 2023-12-12 NOTE — ED Notes (Signed)
 Went to waiting room to call patient back for repeat troponin. Pt did not respond when name called, possibly in CT. KIT

## 2023-12-13 NOTE — ED Provider Notes (Signed)
 [place] EMERGENCY DEPARTMENT AT [facility] Provider Note   CSN: [id number] Arrival date & time: 12/12/23  1344     Patient presents with: Chest Pain   Kayla Robles is a 53 y.o. female.  Patient with past medical history significant for type II DM, generalized anxiety disorder, hypertension, chronic foot pain, peripheral neuropathy, elevated LFTs on Ozempic  for diabetic management and weight loss presents to the emergency department complaining of intermittent chest pain described as a burning sensation.  This is reportedly been ongoing for weeks.  The patient believes that it began very shortly after her Ozempic  dosage was increased weeks ago.  She states that occasionally some pain radiates to her back.  She currently has no pain at the time of my assessment.  The patient also complains of intermittent swelling in her feet with pain.  She states she works on her feet and is on her feet throughout the day.  The pain has moved from the left foot to the right foot but currently there is no pain.  She denies shortness of breath, abdominal pain, nausea, vomiting.  She called the nurse triage line today to schedule an appointment but due to her symptoms they recommended ER evaluation instead.    Chest Pain      Prior to Admission medications   Medication Sig Start Date End Date Taking? Authorizing Provider  gabapentin  (NEURONTIN ) 100 MG capsule TAKE 1 CAPSULE AT BEDTIME FOR PAIN AND SLEEP 08/25/23   Clark, Katherine K, NP  hydrOXYzine  (ATARAX ) 25 MG tablet TAKE 1 TABLET AT BEDTIME AS NEEDED FOR ANXIETY AND SLEEP Patient not taking: Reported on 07/22/2023 03/04/22   Clark, Katherine K, NP  lisinopril  (ZESTRIL ) 20 MG tablet TAKE 1 TABLET DAILY FOR BLOOD PRESSURE 09/27/23   Clark, Katherine K, NP  metFORMIN  (GLUCOPHAGE ) 500 MG tablet TAKE 3 TABLETS DAILY WITH BREAKFAST FOR DIABETES 10/17/23   Clark, Katherine K, NP  pravastatin  (PRAVACHOL ) 40 MG tablet Take 1 tablet (40 mg total) by  mouth daily. for cholesterol. Patient not taking: Reported on 07/22/2023 08/19/22   Gretta Comer POUR, NP  Semaglutide ,0.25 or 0.5MG /DOS, (OZEMPIC , 0.25 OR 0.5 MG/DOSE,) 2 MG/3ML SOPN Inject 0.5 mg into the skin once a week. for diabetes. 09/02/23   Clark, Katherine K, NP  venlafaxine  XR (EFFEXOR -XR) 150 MG 24 hr capsule TAKE 1 CAPSULE DAILY WITH BREAKFAST FOR ANXIETY 08/29/23   Clark, Katherine K, NP    Allergies: Levaquin [levofloxacin], Zithromax [azithromycin], Other, and Paroxetine hcl    Review of Systems  Cardiovascular:  Positive for chest pain.    Updated Vital Signs BP 136/88 (BP Location: Left Arm)   Pulse 100   Temp 98 F (36.7 C)   Resp 18   Ht 5' 2 (1.575 m)   Wt 86.2 kg   LMP 03/12/2016   SpO2 95%   BMI 34.75 kg/m   Physical Exam Vitals and nursing note reviewed.  Constitutional:      General: She is not in acute distress.    Appearance: She is well-developed.  HENT:     Head: Normocephalic and atraumatic.  Eyes:     Conjunctiva/sclera: Conjunctivae normal.  Cardiovascular:     Rate and Rhythm: Normal rate and regular rhythm.     Heart sounds: No murmur heard. Pulmonary:     Effort: Pulmonary effort is normal. No respiratory distress.     Breath sounds: Normal breath sounds.  Chest:     Chest wall: No tenderness.  Abdominal:  Palpations: Abdomen is soft.     Tenderness: There is no abdominal tenderness.  Musculoskeletal:        General: No swelling.     Cervical back: Neck supple.     Right lower leg: No edema.     Left lower leg: No edema.     Comments: No appreciable swelling/edema of bilateral lower extremities upon my examination  Skin:    General: Skin is warm and dry.     Capillary Refill: Capillary refill takes less than 2 seconds.  Neurological:     Mental Status: She is alert.  Psychiatric:        Mood and Affect: Mood normal.     (all labs ordered are listed, but only abnormal results are displayed) Labs Reviewed  COMPREHENSIVE  METABOLIC PANEL WITH GFR - Abnormal; Notable for the following components:      Result Value   Glucose, Bld 109 (*)    Calcium  10.4 (*)    All other components within normal limits  CBC  LIPASE, BLOOD  D-DIMER, QUANTITATIVE  TROPONIN I (HIGH SENSITIVITY)  TROPONIN I (HIGH SENSITIVITY)    EKG: EKG Interpretation Date/Time:  Monday December 12 2023 14:46:58 EST Ventricular Rate:  78 PR Interval:  136 QRS Duration:  78 QT Interval:  350 QTC Calculation: 399 R Axis:   -7  Text Interpretation: Normal sinus rhythm Cannot rule out Anterior infarct  old Confirmed by Palumbo, April ([id number]) on 12/12/2023 11:51:11 PM  Radiology: CT Angio Chest/Abd/Pel for Dissection W and/or W/WO Result Date: 12/12/2023 CLINICAL DATA:  Aortic aneurysm suspected. EXAM: CT ANGIOGRAPHY CHEST, ABDOMEN AND PELVIS TECHNIQUE: Non-contrast CT of the chest was initially obtained. Multidetector CT imaging through the chest, abdomen and pelvis was performed using the standard protocol during bolus administration of intravenous contrast. Multiplanar reconstructed images and MIPs were obtained and reviewed to evaluate the vascular anatomy. RADIATION DOSE REDUCTION: This exam was performed according to the departmental dose-optimization program which includes automated exposure control, adjustment of the mA and/or kV according to patient size and/or use of iterative reconstruction technique. CONTRAST:  OMNIPAQUE IOHEXOL 350 MG/ML SOLN COMPARISON:  CT abdomen pelvis 01/01/2021. FINDINGS: CTA CHEST FINDINGS Cardiovascular: No evidence of an intramural hematoma on precontrast imaging nor aneurysm or dissection on postcontrast imaging. Heart size normal. No pericardial effusion. Mediastinum/Nodes: No pathologically enlarged mediastinal, hilar or axillary lymph nodes. Esophagus is grossly unremarkable. Lungs/Pleura: Minimal mosaic pulmonary parenchymal attenuation, nonspecific. No airspace consolidation or pleural fluid. Airway  is unremarkable. Musculoskeletal: Degenerative changes in the spine. Review of the MIP images confirms the above findings. CTA ABDOMEN AND PELVIS FINDINGS VASCULAR Aorta: No aneurysm or dissection.  Atherosclerotic calcification. Celiac: Widely patent.  No aneurysm. SMA: Widely patent. Renals: Widely patent, single bilaterally. IMA: Mildly patent. Inflow: No aneurysm or dissection.  Widely patent. Veins: Poorly evaluated due to lack of contrast opacification. Review of the MIP images confirms the above findings. NON-VASCULAR Hepatobiliary: Liver is unremarkable. Cholecystectomy. No biliary ductal dilatation. Pancreas: Negative. Spleen: Negative. Adrenals/Urinary Tract: Adrenal glands and right kidney are unremarkable. There may be a punctate left renal stone (7/152). Ureters are decompressed. Bladder is grossly unremarkable. Stomach/Bowel: Stomach is unremarkable. Duodenal diverticulum is incidentally noted. Small bowel, appendix and colon are unremarkable. Lymphatic: No pathologically enlarged lymph nodes. Reproductive: Uterus is visualized.  No adnexal mass. Other: No free fluid.  Mesenteries and peritoneum are unremarkable. Musculoskeletal: Degenerative changes in the spine. Minimal retrolisthesis of L5 on S1. Review of the MIP images confirms the above  findings. IMPRESSION: 1. No evidence of aortic aneurysm or dissection. 2. Possible punctate left renal stone. Electronically Signed   By: Newell Eke M.D.   On: 12/12/2023 17:35   DG Chest 1 View Result Date: 12/12/2023 CLINICAL DATA:  Chest pain EXAM: DG CHEST 1V COMPARISON:  04/19/2017 FINDINGS: The heart size and mediastinal contours are within normal limits. Both lungs are clear. The visualized skeletal structures are unremarkable. IMPRESSION: No active disease. Electronically Signed   By: CHRISTELLA.  Shick M.D.   On: 12/12/2023 16:03     Procedures   Medications Ordered in the ED  iohexol (OMNIPAQUE) 350 MG/ML injection 100 mL (100 mLs Intravenous  Contrast Given 12/12/23 1641)                                    Medical Decision Making  This patient presents to the ED for concern of chest pain, this involves an extensive number of treatment options, and is a complaint that carries with it a high risk of complications and morbidity.  The differential diagnosis includes GERD, anxiety, ACS, PE, dissection, others   Co morbidities / Chronic conditions that complicate the patient evaluation  As noted in HPI   Additional history obtained:  Additional history obtained from EMR External records from outside source obtained and reviewed including primary care notes   Lab Tests:  I Ordered, and personally interpreted labs.  The pertinent results include:  Initial troponin 3, repeat <2, d-dimer <0.27, unremarkable CBC and CMP   Imaging Studies ordered:  I ordered imaging studies including chest x-ray and CT angio chest/abd/pelvis dissection study  I independently visualized and interpreted imaging which showed  1. No evidence of aortic aneurysm or dissection.  2. Possible punctate left renal stone.   I agree with the radiologist interpretation   Cardiac Monitoring: / EKG:  The patient was maintained on a cardiac monitor.  I personally viewed and interpreted the cardiac monitored which showed an underlying rhythm of: sinus rhythm   Test / Admission - Considered:  Patient with negative troponins x 2.  Grossly unremarkable EKG and imaging. Symptoms seem consistent with GERD with symptoms starting after recent increase in Ozempic  dose. No sign of ACS, PE, dissection. Patient appears stable for outpatient follow up at this time. Return precautions provided.       Final diagnoses:  Chest pain, unspecified type    ED Discharge Orders     None          Logan Ubaldo KATHEE DEVONNA 12/13/23 0112    Palumbo, April, MD 12/13/23 [phone]

## 2023-12-13 NOTE — Discharge Instructions (Signed)
 Your workup this evening was reassuring.  Please follow-up with your primary care provider for further evaluation as needed.  You may try over-the-counter antacid such as Pepcid to see if it relieves some of your symptoms.  I also recommend trying compression socks.  Return to the emergency department if you develop further chest pain or other life-threatening symptoms.

## 2023-12-13 NOTE — ED Notes (Signed)
 D/c cardiac monitoring per [place], [place]

## 2024-01-05 ENCOUNTER — Inpatient Hospital Stay: Admitting: Primary Care

## 2024-01-24 ENCOUNTER — Telehealth: Payer: Self-pay | Admitting: Primary Care

## 2024-01-24 NOTE — Telephone Encounter (Signed)
 Type of form received: FMLA   Additional comments: Faxed ub    Received ab:[id number]    Form should be Faxed to:   Form should be mailed to:     Is patient requesting call for pickup:     Form placed:  in [employer]ml[employer] specialist box    Attach charge sheet.Y    Individual made aware of 3-5 business day turn around (Y/N)? No faxed in on behalf of patient

## 2024-01-24 NOTE — Telephone Encounter (Signed)
 Forms received   Will fax to [employer] at [phone] when complete  Pt will pick up her copy at the front desk on her appointment date of 1.6.26  Placed in providers box for review

## 2024-01-25 DIAGNOSIS — Z0279 Encounter for issue of other medical certificate: Secondary | ICD-10-CM

## 2024-01-25 NOTE — Telephone Encounter (Signed)
 Completed and placed on Erin's desk.

## 2024-01-25 NOTE — Telephone Encounter (Signed)
 Completed forms received and faxed to [employer] at [phone]  Copy sent to scan  Copy left at front desk for patient to pick up on her appointment date of 1.6.26

## 2024-01-27 ENCOUNTER — Ambulatory Visit: Admitting: Primary Care

## 2024-01-31 ENCOUNTER — Ambulatory Visit: Admitting: Primary Care

## 2024-01-31 ENCOUNTER — Encounter: Payer: Self-pay | Admitting: Primary Care

## 2024-01-31 ENCOUNTER — Ambulatory Visit: Payer: Self-pay | Admitting: Primary Care

## 2024-01-31 VITALS — BP 118/78 | HR 90 | Temp 98.4°F | Ht 62.0 in | Wt 191.2 lb

## 2024-01-31 DIAGNOSIS — M79673 Pain in unspecified foot: Secondary | ICD-10-CM | POA: Diagnosis not present

## 2024-01-31 DIAGNOSIS — E1165 Type 2 diabetes mellitus with hyperglycemia: Secondary | ICD-10-CM | POA: Diagnosis not present

## 2024-01-31 DIAGNOSIS — G8929 Other chronic pain: Secondary | ICD-10-CM | POA: Diagnosis not present

## 2024-01-31 DIAGNOSIS — Z7984 Long term (current) use of oral hypoglycemic drugs: Secondary | ICD-10-CM

## 2024-01-31 DIAGNOSIS — Z7985 Long-term (current) use of injectable non-insulin antidiabetic drugs: Secondary | ICD-10-CM | POA: Diagnosis not present

## 2024-01-31 LAB — POCT GLYCOSYLATED HEMOGLOBIN (HGB A1C): Hemoglobin A1C: 6.6 % — AB (ref 4.0–5.6)

## 2024-01-31 MED ORDER — GABAPENTIN 300 MG PO CAPS: 300.0000 mg | ORAL_CAPSULE | Freq: Every day | ORAL | 1 refills | Status: AC

## 2024-01-31 NOTE — Patient Instructions (Signed)
 We changed the dose of your gabapentin  to 300 mg capsule.  Take 1 capsule by mouth at bedtime.  Please schedule a physical to meet with me in 6 months.   It was a pleasure to see you today!

## 2024-01-31 NOTE — Progress Notes (Signed)
 "  Subjective:    Patient ID: Kayla Robles, female    DOB: 11-04-1970, 54 y.o.   MRN: [id number]  Kayla Robles is a very pleasant 54 y.o. female with a history of hypertension, type 2 diabetes, GAD, MDD, hyperlipidemia who presents today for follow-up of diabetes.  She is also needing a refill of her gabapentin . She is taking 3 capsules at a time.   1) Type 2 Diabetes:  Current medications include: Metformin  500 mg twice daily, Ozempic  0.5 mg weekly  She is checking her blood glucose 2-3 times monthly and is getting readings of 140s-170s.  Last A1C: 6.3 in June 2025, 6.6 today Last Eye Exam:Due Last Foot Exam: Due Pneumonia Vaccination: 2024 Urine Microalbumin: UTD Statin: Intolerant  Dietary changes since last visit: Less portion sizes, reduced appetite. Trying to make better food choices by limiting potatoes, increasing intake of greens.    Exercise: Active.   Wt Readings from Last 3 Encounters:  01/31/24 191 lb 4 oz (86.8 kg)  12/12/23 190 lb (86.2 kg)  07/22/23 198 lb (89.8 kg)      Review of Systems  Respiratory:  Negative for shortness of breath.   Cardiovascular:  Negative for chest pain.  Gastrointestinal:  Negative for constipation, diarrhea and nausea.  Neurological:  Negative for numbness.         Past Medical History:  Diagnosis Date   Acute gout involving toe of right foot 08/10/2022   Anxiety    Hypertension    Kidney stones    S/P cholecystectomy t   S/P tubal ligation 01/25/1990    Social History   Socioeconomic History   Marital status: Married    Spouse name: Not on file   Number of children: Not on file   Years of education: Not on file   Highest education level: 12th grade  Occupational History   Not on file  Tobacco Use   Smoking status: Never   Smokeless tobacco: Never  Substance and Sexual Activity   Alcohol use: No   Drug use: No   Sexual activity: Not on file  Other Topics Concern   Not on file  Social  History Narrative   Married.   Works as a air traffic controller.   Enjoys playing with her grandchildren.   Social Drivers of Health   Tobacco Use: Low Risk (01/31/2024)   Patient History    Smoking Tobacco Use: Never    Smokeless Tobacco Use: Never    Passive Exposure: Not on file  Financial Resource Strain: Low Risk (01/24/2024)   Overall Financial Resource Strain (CARDIA)    Difficulty of Paying Living Expenses: Not hard at all  Food Insecurity: No Food Insecurity (01/24/2024)   Epic    Worried About Programme Researcher, Broadcasting/film/video in the Last Year: Never true    Ran Out of Food in the Last Year: Never true  Transportation Needs: No Transportation Needs (01/24/2024)   Epic    Lack of Transportation (Medical): No    Lack of Transportation (Non-Medical): No  Physical Activity: Insufficiently Active (01/24/2024)   Exercise Vital Sign    Days of Exercise per Week: 2 days    Minutes of Exercise per Session: 20 min  Stress: Stress Concern Present (01/24/2024)   [employer] of Occupational Health - Occupational Stress Questionnaire    Feeling of Stress: To some extent  Social Connections: Socially Integrated (01/24/2024)   Social Connection and Isolation Panel    Frequency of Communication with Friends and  Family: Three times a week    Frequency of Social Gatherings with Friends and Family: Once a week    Attends Religious Services: More than 4 times per year    Active Member of Clubs or Organizations: Yes    Attends Banker Meetings: 1 to 4 times per year    Marital Status: Married  Catering Manager Violence: Not on file  Depression (PHQ2-9): Low Risk (01/31/2024)   Depression (PHQ2-9)    PHQ-2 Score: 2  Alcohol Screen: Low Risk (01/24/2024)   Alcohol Screen    Last Alcohol Screening Score (AUDIT): 1  Housing: Low Risk (01/24/2024)   Epic    Unable to Pay for Housing in the Last Year: No    Number of Times Moved in the Last Year: 0    Homeless in the Last Year: No  Utilities:  Not on file  Health Literacy: Not on file    Past Surgical History:  Procedure Laterality Date   CHOLECYSTECTOMY     LITHOTRIPSY      Family History  Problem Relation Age of Onset   Arthritis Mother    Heart disease Mother    Hypertension Mother    Hypertension Father    Diabetes Father     Allergies[1]  Medications Ordered Prior to Encounter[2]  BP 118/78   Pulse 90   Temp 98.4 F (36.9 C) (Oral)   Ht 5' 2 (1.575 m)   Wt 191 lb 4 oz (86.8 kg)   LMP 03/12/2016   SpO2 94%   BMI 34.98 kg/m  Objective:   Physical Exam Cardiovascular:     Rate and Rhythm: Normal rate and regular rhythm.  Pulmonary:     Effort: Pulmonary effort is normal.     Breath sounds: Normal breath sounds.  Musculoskeletal:     Cervical back: Neck supple.  Skin:    General: Skin is warm and dry.  Neurological:     Mental Status: She is alert and oriented to person, place, and time.  Psychiatric:        Mood and Affect: Mood normal.     Physical Exam        Assessment & Plan:  Type 2 diabetes mellitus with hyperglycemia, without long-term current use of insulin (HCC) Assessment & Plan: Slightly deteriorated but overall controlled with A1C of 6.6 today.  She will continue to work on her diet. Continue metformin  500 mg twice daily, Ozempic  0.5 mg weekly.  Foot exam today.  Follow-up in 6 months  Orders: -     POCT glycosylated hemoglobin (Hb A1C)  Chronic foot pain, unspecified laterality -     Gabapentin ; Take 1 capsule (300 mg total) by mouth at bedtime. For foot pain  Dispense: 90 capsule; Refill: 1    Assessment and Plan Assessment & Plan         Comer MARLA Gaskins, NP       [1]  Allergies Allergen Reactions   Levaquin [Levofloxacin] Anaphylaxis    felt weird felt worse than usually felt   Zithromax [Azithromycin] Anaphylaxis   Other Other (See Comments)    Most anxiety medications   Paroxetine Hcl Other (See Comments)    suicidal  [2]   Current Outpatient Medications on File Prior to Visit  Medication Sig Dispense Refill   hydrOXYzine  (ATARAX ) 25 MG tablet TAKE 1 TABLET AT BEDTIME AS NEEDED FOR ANXIETY AND SLEEP 90 tablet 0   lisinopril  (ZESTRIL ) 20 MG tablet TAKE 1 TABLET DAILY FOR BLOOD  PRESSURE 90 tablet 2   metFORMIN  (GLUCOPHAGE ) 500 MG tablet TAKE 3 TABLETS DAILY WITH BREAKFAST FOR DIABETES 270 tablet 1   Semaglutide ,0.25 or 0.5MG /DOS, (OZEMPIC , 0.25 OR 0.5 MG/DOSE,) 2 MG/3ML SOPN Inject 0.5 mg into the skin once a week. for diabetes. 9 mL 1   venlafaxine  XR (EFFEXOR -XR) 150 MG 24 hr capsule TAKE 1 CAPSULE DAILY WITH BREAKFAST FOR ANXIETY 90 capsule 2   No current facility-administered medications on file prior to visit.   "

## 2024-01-31 NOTE — Assessment & Plan Note (Signed)
 Slightly deteriorated but overall controlled with A1C of 6.6 today.  She will continue to work on her diet. Continue metformin  500 mg twice daily, Ozempic  0.5 mg weekly.  Foot exam today.  Follow-up in 6 months

## 2024-02-12 DIAGNOSIS — E1165 Type 2 diabetes mellitus with hyperglycemia: Secondary | ICD-10-CM

## 2024-02-14 LAB — OPHTHALMOLOGY REPORT-SCANNED

## 2024-02-16 MED ORDER — OZEMPIC (0.25 OR 0.5 MG/DOSE) 2 MG/3ML ~~LOC~~ SOPN: 0.5000 mg | PEN_INJECTOR | SUBCUTANEOUS | 1 refills | Status: AC

## 2024-08-01 ENCOUNTER — Encounter: Admitting: Primary Care
